# Patient Record
Sex: Male | Born: 1960 | Race: White | Hispanic: No | Marital: Married | Smoking: Never smoker
Health system: Southern US, Community
[De-identification: ages and names within clinical notes are randomized; demographics above are authoritative.]

## PROBLEM LIST (undated history)

## (undated) DIAGNOSIS — N200 Calculus of kidney: Secondary | ICD-10-CM

## (undated) DIAGNOSIS — T7840XA Allergy, unspecified, initial encounter: Secondary | ICD-10-CM

## (undated) DIAGNOSIS — K5790 Diverticulosis of intestine, part unspecified, without perforation or abscess without bleeding: Secondary | ICD-10-CM

## (undated) HISTORY — DX: Allergy, unspecified, initial encounter: T78.40XA

## (undated) HISTORY — PX: NO PAST SURGERIES: SHX2092

## (undated) HISTORY — DX: Diverticulosis of intestine, part unspecified, without perforation or abscess without bleeding: K57.90

## (undated) HISTORY — DX: Calculus of kidney: N20.0

---

## 1998-05-08 ENCOUNTER — Emergency Department (HOSPITAL_COMMUNITY): Admission: EM | Admit: 1998-05-08 | Discharge: 1998-05-08 | Payer: Self-pay | Admitting: Emergency Medicine

## 1998-05-11 ENCOUNTER — Encounter: Admission: RE | Admit: 1998-05-11 | Discharge: 1998-08-09 | Payer: Self-pay | Admitting: Internal Medicine

## 1999-03-17 ENCOUNTER — Emergency Department (HOSPITAL_COMMUNITY): Admission: EM | Admit: 1999-03-17 | Discharge: 1999-03-17 | Payer: Self-pay | Admitting: Emergency Medicine

## 2004-06-11 ENCOUNTER — Emergency Department (HOSPITAL_COMMUNITY): Admission: EM | Admit: 2004-06-11 | Discharge: 2004-06-11 | Payer: Self-pay | Admitting: Emergency Medicine

## 2004-07-04 ENCOUNTER — Ambulatory Visit (HOSPITAL_COMMUNITY): Admission: RE | Admit: 2004-07-04 | Discharge: 2004-07-04 | Payer: Self-pay | Admitting: Chiropractic Medicine

## 2007-08-28 ENCOUNTER — Emergency Department (HOSPITAL_COMMUNITY): Admission: EM | Admit: 2007-08-28 | Discharge: 2007-08-28 | Payer: Self-pay | Admitting: Emergency Medicine

## 2011-06-30 LAB — URINALYSIS, ROUTINE W REFLEX MICROSCOPIC
Glucose, UA: NEGATIVE
Hgb urine dipstick: NEGATIVE
Ketones, ur: 15 — AB
Protein, ur: NEGATIVE
pH: 5

## 2011-06-30 LAB — POCT CARDIAC MARKERS
CKMB, poc: <1 — ABNORMAL LOW
Operator id: 265201
Operator id: 285491
Troponin i, poc: <0.05
Troponin i, poc: <0.05

## 2011-06-30 LAB — COMPREHENSIVE METABOLIC PANEL
ALT: 13
AST: 17
CO2: 26
Chloride: 106
GFR calc Af Amer: >60
GFR calc non Af Amer: >60
Potassium: 4.2
Sodium: 139
Total Bilirubin: 1

## 2011-06-30 LAB — CBC
HCT: 44.1
Hemoglobin: 15.3
MCHC: 34.6
MCV: 86.8
Platelets: 236
RBC: 5.08
RDW: 12.9
WBC: 7.9

## 2011-06-30 LAB — DIFFERENTIAL
Basophils Absolute: 0
Eosinophils Absolute: 0.6
Eosinophils Relative: 7 — ABNORMAL HIGH

## 2011-06-30 LAB — PROTIME-INR: Prothrombin Time: 13.2

## 2013-09-21 ENCOUNTER — Other Ambulatory Visit: Payer: Self-pay | Admitting: Emergency Medicine

## 2013-09-21 MED ORDER — OSELTAMIVIR PHOSPHATE 75 MG PO CAPS: 75.0000 mg | ORAL_CAPSULE | Freq: Two times a day (BID) | ORAL | Status: DC

## 2013-09-21 MED ORDER — PREDNISONE (PAK) 10 MG PO TABS
ORAL_TABLET | Freq: Every day | ORAL | Status: DC
Start: 2013-09-21 — End: 2014-02-23

## 2013-09-21 MED ORDER — AZITHROMYCIN 250 MG PO TABS: ORAL_TABLET | ORAL | Status: AC

## 2013-09-21 MED ORDER — ONDANSETRON HCL 8 MG PO TABS: 8.0000 mg | ORAL_TABLET | Freq: Three times a day (TID) | ORAL | Status: DC | PRN

## 2014-02-23 ENCOUNTER — Encounter: Payer: Self-pay | Admitting: Physician Assistant

## 2014-02-23 ENCOUNTER — Ambulatory Visit (INDEPENDENT_AMBULATORY_CARE_PROVIDER_SITE_OTHER): Payer: Self-pay | Admitting: Physician Assistant

## 2014-02-23 VITALS — BP 120/80 | HR 60 | Temp 97.9°F | Resp 16 | Ht 73.0 in | Wt 209.0 lb

## 2014-02-23 DIAGNOSIS — T148 Other injury of unspecified body region: Secondary | ICD-10-CM

## 2014-02-23 DIAGNOSIS — W57XXXA Bitten or stung by nonvenomous insect and other nonvenomous arthropods, initial encounter: Secondary | ICD-10-CM

## 2014-02-23 MED ORDER — DOXYCYCLINE HYCLATE 100 MG PO TABS
100.0000 mg | ORAL_TABLET | Freq: Two times a day (BID) | ORAL | Status: DC
Start: 2014-02-23 — End: 2016-10-21

## 2014-02-23 NOTE — Progress Notes (Signed)
   Subjective:    Patient ID: Marcus Roberson, male    DOB: [birth year], 53 y.o.   MRN: [id number]  HPI 53 y.o. male with possible insect bite- unknown what kind. He has noticed it 2 days ago with itching, then last night he noticed that it had gotten very red with dark/ecchimosis around it.     Review of Systems  Constitutional: Negative.   HENT: Negative.   Respiratory: Negative.   Cardiovascular: Negative.   Gastrointestinal: Negative.   Genitourinary: Negative.   Musculoskeletal: Negative.   Skin: Positive for rash and wound.  Neurological: Negative.        Objective:   Physical Exam  Constitutional: He is oriented to person, place, and time. He appears well-developed and well-nourished.  Neck: Normal range of motion. Neck supple.  Cardiovascular: Normal rate and regular rhythm.   No murmur heard. Pulmonary/Chest: Effort normal and breath sounds normal. He has no wheezes.  Abdominal: Soft. Bowel sounds are normal. There is no tenderness.  Musculoskeletal: Normal range of motion. He exhibits no edema and no tenderness.  Lymphadenopathy:    He has no cervical adenopathy.  Neurological: He is alert and oriented to person, place, and time.  Skin: Skin is warm and dry. Rash (right hip 4x3 cm area of dark purple,red with induration/punctuate in the center) noted.      Assessment & Plan:  Insect bite without infection- no systemic symptoms- ice, hydrocortisone- if it becomes warm, spreads, or if he gets fever, joint pain, he will get Doxycycline #20.

## 2014-02-23 NOTE — Patient Instructions (Signed)
Insect Bite Mosquitoes, flies, fleas, bedbugs, and many other insects can bite. Insect bites are different from insect stings. A sting is when venom is injected into the skin. Some insect bites can transmit infectious diseases. SYMPTOMS  Insect bites usually turn red, swell, and itch for 2 to 4 days. They often go away on their own. TREATMENT  Your caregiver may prescribe antibiotic medicines if a bacterial infection develops in the bite. HOME CARE INSTRUCTIONS  Do not scratch the bite area.  Keep the bite area clean and dry. Wash the bite area thoroughly with soap and water.  Put ice or cool compresses on the bite area.  Put ice in a plastic bag.  Place a towel between your skin and the bag.  Leave the ice on for 20 minutes, 4 times a day for the first 2 to 3 days, or as directed.  You may apply a baking soda paste, cortisone cream, or calamine lotion to the bite area as directed by your caregiver. This can help reduce itching and swelling.  Only take over-the-counter or prescription medicines as directed by your caregiver.  If you are given antibiotics, take them as directed. Finish them even if you start to feel better. You may need a tetanus shot if:  You cannot remember when you had your last tetanus shot.  You have never had a tetanus shot.  The injury broke your skin. If you get a tetanus shot, your arm may swell, get red, and feel warm to the touch. This is common and not a problem. If you need a tetanus shot and you choose not to have one, there is a rare chance of getting tetanus. Sickness from tetanus can be serious. SEEK IMMEDIATE MEDICAL CARE IF:   You have increased pain, redness, or swelling in the bite area.  You see a red line on the skin coming from the bite.  You have a fever.  You have joint pain.  You have a headache or neck pain.  You have unusual weakness.  You have a rash.  You have chest pain or shortness of breath.  You have abdominal pain,  nausea, or vomiting.  You feel unusually tired or sleepy. MAKE SURE YOU:   Understand these instructions.  Will watch your condition.  Will get help right away if you are not doing well or get worse. Document Released: 10/16/2004 Document Revised: 12/01/2011 Document Reviewed: 04/09/2011 [facility] Patient Information 2014 [place], [place]. Brown Recluse Spider Bite The brown recluse spider is dark brown or light tan. It has a dark spot shaped like a violin on its back. The whole spider (with legs) can grow to be 1 inch (2.5 centimeters) long. Brown recluse spider bites can be serious and life-threatening. HOME CARE  Do not scratch the bite. Keep the bite clean and covered with a bandage.  Wash the bite every day with warm, soapy water.  Put ice on the bite.  Put ice in a plastic bag.  Place a towel between your skin and the bag.  Leave the ice on for 20 to 30 minutes, 3 to 4 times a day, or as told by your doctor.  Keep the bite raised (elevated) above the level of your heart.  Only take medicines as told by your doctor. You may need a tetanus shot if:  You cannot remember when you had your last tetanus shot.  You have never had a tetanus shot.  The injury broke your skin. If you need a tetanus  shot and you choose not to have one, you may get tetanus. Sickness from tetanus can be serious. GET HELP RIGHT AWAY IF:  Your sore (lesion) is getting larger (more than  inch [5 millimeters]), growing deeper, or looks infected.  You have chills or a fever.  You feel sick to your stomach (nauseous) or throw up (vomit).  You have muscle aches, shaking (convulsions), or a red rash.  You feel weak or very tired.  You are producing less pee (urine).  You have blood in your pee, or you notice other bleeding.  Your skin turns yellow.  Your problems do not get better after 24 hours, or they get worse.  You have more pain in the bite area. MAKE SURE YOU:   Understand these  instructions.  Will watch your condition.  Will get help right away if you are not doing well or get worse. Document Released: 08/28/2011 Document Revised: 12/01/2011 Document Reviewed: 08/28/2011 [facility] Patient Information 2014 [place]

## 2016-10-21 ENCOUNTER — Encounter: Payer: Self-pay | Admitting: Physician Assistant

## 2016-10-21 ENCOUNTER — Ambulatory Visit (INDEPENDENT_AMBULATORY_CARE_PROVIDER_SITE_OTHER): Payer: Self-pay | Admitting: Physician Assistant

## 2016-10-21 VITALS — BP 130/76 | HR 73 | Temp 97.6°F | Resp 14 | Ht 73.0 in | Wt 199.2 lb

## 2016-10-21 DIAGNOSIS — G5601 Carpal tunnel syndrome, right upper limb: Secondary | ICD-10-CM

## 2016-10-21 MED ORDER — PREDNISONE 20 MG PO TABS: ORAL_TABLET | ORAL | 0 refills | Status: AC

## 2016-10-21 MED ORDER — MELOXICAM 15 MG PO TABS: ORAL_TABLET | ORAL | 1 refills | Status: DC

## 2016-10-21 NOTE — Progress Notes (Signed)
   Subjective:    Patient ID: Marcus Roberson, male    DOB: [birth year], [age] y.o.   MRN: [id number]  HPI 56 y.o. right handed WM presents with right hand pain and numbness x 1 week. He has thumb pain at MCP x 3 months, bilateral hands but worse last week, and most recently this past week he has numbness tingling 1st-3rd digits. No injury. Mild neck pain if he holds his head up looking under houses for long periods of time, no shoulder, elbow pain. Has had some weakness in grip and dropping things.  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 Worse in the AM.   Blood pressure 130/76, pulse 73, temperature 97.6 F (36.4 C), resp. rate 14, height 6\' 1"  (1.854 m), weight 199 lb 3.2 oz (90.4 kg), SpO2 98 %.  Medications Current Outpatient Prescriptions on File Prior to Visit  Medication Sig  . Cetirizine HCl (ZYRTEC ALLERGY PO) Take by mouth daily.  . fluticasone (FLONASE) 50 MCG/ACT nasal spray Place 1 spray into both nostrils daily.   No current facility-administered medications on file prior to visit.     Problem list He  does not have a problem list on file.  Review of Systems  Constitutional: Negative.  Negative for chills and fever.  Musculoskeletal: Positive for arthralgias. Negative for back pain, gait problem, joint swelling, myalgias, neck pain and neck stiffness.  Skin: Negative.  Negative for rash.  Neurological: Positive for numbness. Negative for dizziness, tremors, seizures, syncope, facial asymmetry, speech difficulty, weakness, light-headedness and headaches.  Hematological: Negative.        Objective:   Physical Exam  Constitutional: He is oriented to person, place, and time. He appears well-developed and well-nourished. No distress.  Musculoskeletal: Normal range of motion.  Normal ROM neck, no spinal process tenderness, full ROM right shoulder, right elbow. Right carpal tunnel compression  test is positive, no thenar or hypothenar atrophy, sensation diminished on the palmar aspect of the hand digits 1-3, strength not diminished, swelling is not present, synovitis is not present, tenderness is present at right 1st MCP aspect, wrist has normal range of motion and normal pulses/cap refill, negative finkelstein test.   Neurological: He is alert and oriented to person, place, and time. He has normal reflexes. No cranial nerve deficit. Coordination normal.  Skin: Skin is warm and dry. No rash noted.       Assessment & Plan:    Carpal tunnel syndrome on right -     predniSONE (DELTASONE) 20 MG tablet; 1 pill 3 x a day for 3 days, 1 pill 2 x a day x 3 days, 1 pill a day x 5 days with food -     meloxicam (MOBIC) 15 MG tablet; Take one daily with food for 2 weeks, can take with tylenol, can not take with aleve, iburpofen, then as needed daily for pain - carpal tunnel brace at night x 6-8 weeks - stretches, heat or ice, rest - follow up 2 month for recheck and for labs - if not better may have to send to ortho despite being self pay due to affecting his work.

## 2016-10-21 NOTE — Patient Instructions (Signed)
Get carpal tunnel brace, wear at night x 6 weeks Take prednisone as directed, see instructions below May need to get on zantac, it can upset your stomach.  One you are done with the prednisone take the mobic once daily  Follow up 2 months   Carpal Tunnel Syndrome Carpal tunnel syndrome is a condition that causes pain in your hand and arm. The carpal tunnel is a narrow area located on the palm side of your wrist. Repeated wrist motion or certain diseases may cause swelling within the tunnel. This swelling pinches the main nerve in the wrist (median nerve). What are the causes? This condition may be caused by:  Repeated wrist motions.  Wrist injuries.  Arthritis.  A cyst or tumor in the carpal tunnel.  Fluid buildup during pregnancy. Sometimes the cause of this condition is not known. What increases the risk? This condition is more likely to develop in:  People who have jobs that cause them to repeatedly move their wrists in the same motion, such as Health visitorbutchers and cashiers.  Women.  People with certain conditions, such as:  Diabetes.  Obesity.  An underactive thyroid (hypothyroidism).  Kidney failure. What are the signs or symptoms? Symptoms of this condition include:  A tingling feeling in your fingers, especially in your thumb, index, and middle fingers.  Tingling or numbness in your hand.  An aching feeling in your entire arm, especially when your wrist and elbow are bent for long periods of time.  Wrist pain that goes up your arm to your shoulder.  Pain that goes down into your palm or fingers.  A weak feeling in your hands. You may have trouble grabbing and holding items. Your symptoms may feel worse during the night. How is this diagnosed? This condition is diagnosed with a medical history and physical exam. You may also have tests, including:  An electromyogram (EMG). This test measures electrical signals sent by your nerves into the  muscles.  X-rays. How is this treated? Treatment for this condition includes:  Lifestyle changes. It is important to stop doing or modify the activity that caused your condition.  Physical or occupational therapy.  Medicines for pain and inflammation. This may include medicine that is injected into your wrist.  A wrist splint.  Surgery. Follow these instructions at home: If you have a splint:  Wear it as told by your health care provider. Remove it only as told by your health care provider.  Loosen the splint if your fingers become numb and tingle, or if they turn cold and blue.  Keep the splint clean and dry. General instructions  Take over-the-counter and prescription medicines only as told by your health care provider.  Rest your wrist from any activity that may be causing your pain. If your condition is work related, talk to your employer about changes that can be made, such as getting a wrist pad to use while typing.  If directed, apply ice to the painful area:  Put ice in a plastic bag.  Place a towel between your skin and the bag.  Leave the ice on for 20 minutes, 2-3 times per day.  Keep all follow-up visits as told by your health care provider. This is important.  Do any exercises as told by your health care provider, physical therapist, or occupational therapist. Contact a health care provider if:  You have new symptoms.  Your pain is not controlled with medicines.  Your symptoms get worse. This information is not intended to  replace advice given to you by your health care provider. Make sure you discuss any questions you have with your health care provider. Document Released: 09/05/2000 Document Revised: 01/17/2016 Document Reviewed: 01/24/2015 Elsevier Interactive Patient Education  2017 [employer].

## 2016-12-22 ENCOUNTER — Ambulatory Visit: Payer: Self-pay | Admitting: Physician Assistant

## 2016-12-29 ENCOUNTER — Ambulatory Visit: Payer: Self-pay | Admitting: Physician Assistant

## 2017-07-26 NOTE — Progress Notes (Signed)
Assessment and Plan:  Marcus Roberson was seen today for weight loss, gi problem.  Diagnoses and all orders for this visit:  Epigastric pain -     ranitidine (ZANTAC) 300 MG tablet; Once at night time for 2 weeks, then as needed -     pantoprazole (PROTONIX) 40 MG tablet; Take 1 tablet (40 mg total) daily by mouth. -     CBC with Differential/Platelet -     Comprehensive metabolic panel -     Amylase -     Urinalysis, Complete (81001) -     POC Hemoccult Bld/Stl (3-Cd Home Screen); Future       -     Patient to avoid triggers in diet and improve quality of intake overall   Further disposition pending results of labs. Discussed med's effects and SE's.   Over 20 minutes of exam, counseling, chart review, and critical decision making was performed.   No future appointments.  ------------------------------------------------------------------------------------------------------------------   HPI BP (!) 108/54   Pulse 67   Temp 97.7 F (36.5 C)   Ht 6\' 1"  (1.854 m)   Wt 192 lb 6.4 oz (87.3 kg)   SpO2 97%   BMI 25.38 kg/m    55 y.o.male accompanied by his wife, has been seen intermittently at this office for acute visits presents today for past several years without CPE due to financial constraints- with concerns today of frequent epigastric pain after most meals, seems to be triggered worse after "eating junk" - particularly after the patient is snacking while watching TV at night. He also reports intermittent diarrhea after eating some foods, notably peanuts which seem to aggravate symptoms. He also endorses increased reflux symptoms which he is currently not taking anything for. His wife is concerned that he seems to have lost some weight; note a 7 lb weight loss since last visit 10 months ago. Denies melena, hematochezia, other notable changes in stool character, pain with BM, N/V. Denies urinary changes.  He denies known medical conditions, regular medications other than for allergies,  surgical history.   BMI is Body mass index is 25.38 kg/m. Wt Readings from Last 3 Encounters:  07/27/17 192 lb 6.4 oz (87.3 kg)  10/21/16 199 lb 3.2 oz (90.4 kg)  02/23/14 209 lb (94.8 kg)    History reviewed. No pertinent past medical history.   Allergies  Allergen Reactions  . Codeine Other (See Comments)    Stomach ache    Current Outpatient Medications on File Prior to Visit  Medication Sig  . Cetirizine HCl (ZYRTEC ALLERGY PO) Take by mouth daily.  . fluticasone (FLONASE) 50 MCG/ACT nasal spray Place 1 spray into both nostrils daily.  . meloxicam (MOBIC) 15 MG tablet Take one daily with food for 2 weeks, can take with tylenol, can not take with aleve, iburpofen, then as needed daily for pain   No current facility-administered medications on file prior to visit.     ROS: Review of Systems  Constitutional: Negative for malaise/fatigue and weight loss.  HENT: Negative for hearing loss and tinnitus.   Eyes: Negative for blurred vision and double vision.  Respiratory: Negative for cough, shortness of breath and wheezing.   Cardiovascular: Negative for chest pain, palpitations, orthopnea, claudication and leg swelling.  Gastrointestinal: Positive for abdominal pain, diarrhea and heartburn. Negative for blood in stool, constipation, melena, nausea and vomiting.  Genitourinary: Negative.  Negative for dysuria and urgency.  Musculoskeletal: Negative for joint pain and myalgias.  Skin: Negative for rash.  Neurological: Negative  for dizziness, tingling, sensory change, weakness and headaches.  Endo/Heme/Allergies: Negative for polydipsia.  Psychiatric/Behavioral: Negative.   All other systems reviewed and are negative.    Physical Exam:  BP (!) 108/54   Pulse 67   Temp 97.7 F (36.5 C)   Ht 6\' 1"  (1.854 m)   Wt 192 lb 6.4 oz (87.3 kg)   SpO2 97%   BMI 25.38 kg/m   General Appearance: Well nourished, in no apparent distress. Eyes: PERRLA, EOMs, conjunctiva no  swelling or erythema Sinuses: No Frontal/maxillary tenderness ENT/Mouth: Ext aud canals clear, TMs without erythema, bulging. No erythema, swelling, or exudate on post pharynx.  Tonsils not swollen or erythematous. Hearing normal.  Neck: Supple, thyroid normal.  Respiratory: Respiratory effort normal, BS equal bilaterally without rales, rhonchi, wheezing or stridor.  Cardio: RRR with no MRGs. Brisk peripheral pulses without edema.  Abdomen: Soft, + BS.  Epigastric tenderness to deep palpation, no guarding, rebound, hernias, masses. Lymphatics: Non tender without lymphadenopathy.  Musculoskeletal: Full ROM, 5/5 strength, normal gait.  Skin: Warm, dry without rashes, lesions, ecchymosis.  Neuro: Cranial nerves intact. Normal muscle tone, no cerebellar symptoms. Sensation intact.  Psych: Awake and oriented X 3, normal affect, Insight and Judgment appropriate.     Dan Maker, NP 4:14 PM [facility]

## 2017-07-27 ENCOUNTER — Ambulatory Visit (INDEPENDENT_AMBULATORY_CARE_PROVIDER_SITE_OTHER): Payer: Self-pay | Admitting: Adult Health

## 2017-07-27 ENCOUNTER — Encounter: Payer: Self-pay | Admitting: Adult Health

## 2017-07-27 VITALS — BP 108/54 | HR 67 | Temp 97.7°F | Ht 73.0 in | Wt 192.4 lb

## 2017-07-27 DIAGNOSIS — R1013 Epigastric pain: Secondary | ICD-10-CM

## 2017-07-27 MED ORDER — PANTOPRAZOLE SODIUM 40 MG PO TBEC: 40.0000 mg | DELAYED_RELEASE_TABLET | Freq: Every day | ORAL | 3 refills | Status: DC

## 2017-07-27 MED ORDER — RANITIDINE HCL 300 MG PO TABS: ORAL_TABLET | ORAL | 1 refills | Status: DC

## 2017-07-27 NOTE — Patient Instructions (Signed)
Heartburn Heartburn is a type of pain or discomfort that can happen in the throat or chest. It is often described as a burning pain. It may also cause a bad taste in the mouth. Heartburn may feel worse when you lie down or bend over, and it is often worse at night. Heartburn may be caused by stomach contents that move back up into the esophagus (reflux). Follow these instructions at home: Take these actions to decrease your discomfort and to help avoid complications. Diet  Follow a diet as recommended by your health care provider. This may involve avoiding foods and drinks such as: ? Coffee and tea (with or without caffeine). ? Drinks that contain alcohol. ? Energy drinks and sports drinks. ? Carbonated drinks or sodas. ? Chocolate and cocoa. ? Peppermint and mint flavorings. ? Garlic and onions. ? Horseradish. ? Spicy and acidic foods, including peppers, chili powder, curry powder, vinegar, hot sauces, and barbecue sauce. ? Citrus fruit juices and citrus fruits, such as oranges, lemons, and limes. ? Tomato-based foods, such as red sauce, chili, salsa, and pizza with red sauce. ? Fried and fatty foods, such as donuts, french fries, potato chips, and high-fat dressings. ? High-fat meats, such as hot dogs and fatty cuts of red and white meats, such as rib eye steak, sausage, ham, and bacon. ? High-fat dairy items, such as whole milk, butter, and cream cheese.  Eat small, frequent meals instead of large meals.  Avoid drinking large amounts of liquid with your meals.  Avoid eating meals during the 2-3 hours before bedtime.  Avoid lying down right after you eat.  Do not exercise right after you eat. General instructions  Pay attention to any changes in your symptoms.  Take over-the-counter and prescription medicines only as told by your health care provider. Do not take aspirin, ibuprofen, or other NSAIDs unless your health care provider told you to do so.  Do not use any tobacco  products, including cigarettes, chewing tobacco, and e-cigarettes. If you need help quitting, ask your health care provider.  Wear loose-fitting clothing. Do not wear anything tight around your waist that causes pressure on your abdomen.  Raise (elevate) the head of your bed about 6 inches (15 cm).  Try to reduce your stress, such as with yoga or meditation. If you need help reducing stress, ask your health care provider.  If you are overweight, reduce your weight to an amount that is healthy for you. Ask your health care provider for guidance about a safe weight loss goal.  Keep all follow-up visits as told by your health care provider. This is important. Contact a health care provider if:  You have new symptoms.  You have unexplained weight loss.  You have difficulty swallowing, or it hurts to swallow.  You have wheezing or a persistent cough.  Your symptoms do not improve with treatment.  You have frequent heartburn for more than two weeks. Get help right away if:  You have pain in your arms, neck, jaw, teeth, or back.  You feel sweaty, dizzy, or light-headed.  You have chest pain or shortness of breath.  You vomit and your vomit looks like blood or coffee grounds.  Your stool is bloody or black. This information is not intended to replace advice given to you by your health care provider. Make sure you discuss any questions you have with your health care provider. Document Released: 01/25/2009 Document Revised: 02/14/2016 Document Reviewed: 01/03/2015 Elsevier Interactive Patient Education  2017 [employer]   Inc.  

## 2017-07-28 LAB — COMPREHENSIVE METABOLIC PANEL
AG RATIO: 2.2 (calc) (ref 1.0–2.5)
ALKALINE PHOSPHATASE (APISO): 56 U/L (ref 40–115)
ALT: 11 U/L (ref 9–46)
AST: 12 U/L (ref 10–35)
Albumin: 4.4 g/dL (ref 3.6–5.1)
BILIRUBIN TOTAL: 0.6 mg/dL (ref 0.2–1.2)
BUN: 14 mg/dL (ref 7–25)
CALCIUM: 9.6 mg/dL (ref 8.6–10.3)
CHLORIDE: 105 mmol/L (ref 98–110)
CO2: 32 mmol/L (ref 20–32)
Creat: 1.21 mg/dL (ref 0.70–1.33)
Globulin: 2 g/dL (calc) (ref 1.9–3.7)
Glucose, Bld: 73 mg/dL (ref 65–99)
Potassium: 4.6 mmol/L (ref 3.5–5.3)
SODIUM: 142 mmol/L (ref 135–146)
Total Protein: 6.4 g/dL (ref 6.1–8.1)

## 2017-07-28 LAB — URINALYSIS, COMPLETE
Bacteria, UA: NONE SEEN /HPF
Bilirubin Urine: NEGATIVE
Glucose, UA: NEGATIVE
HGB URINE DIPSTICK: NEGATIVE
Hyaline Cast: NONE SEEN /LPF
Ketones, ur: NEGATIVE
Leukocytes, UA: NEGATIVE
Nitrite: NEGATIVE
PH: 7 (ref 5.0–8.0)
PROTEIN: NEGATIVE
RBC / HPF: NONE SEEN /HPF (ref 0–2)
Specific Gravity, Urine: 1.021 (ref 1.001–1.03)
Squamous Epithelial / LPF: NONE SEEN /HPF (ref <=5)
WBC UA: NONE SEEN /HPF (ref 0–5)

## 2017-07-28 LAB — CBC WITH DIFFERENTIAL/PLATELET
Basophils Absolute: 94 cells/uL (ref 0–200)
Basophils Relative: 1 %
EOS PCT: 9.2 %
Eosinophils Absolute: 865 cells/uL — ABNORMAL HIGH (ref 15–500)
HEMATOCRIT: 44.7 % (ref 38.5–50.0)
Hemoglobin: 15.5 g/dL (ref 13.2–17.1)
LYMPHS ABS: 1419 {cells}/uL (ref 850–3900)
MCH: 29.7 pg (ref 27.0–33.0)
MCHC: 34.7 g/dL (ref 32.0–36.0)
MCV: 85.6 fL (ref 80.0–100.0)
MPV: 11.2 fL (ref 7.5–12.5)
Monocytes Relative: 6.5 %
NEUTROS PCT: 68.2 %
Neutro Abs: 6411 cells/uL (ref 1500–7800)
Platelets: 261 10*3/uL (ref 140–400)
RBC: 5.22 10*6/uL (ref 4.20–5.80)
RDW: 12.9 % (ref 11.0–15.0)
TOTAL LYMPHOCYTE: 15.1 %
WBC mixed population: 611 cells/uL (ref 200–950)
WBC: 9.4 10*3/uL (ref 3.8–10.8)

## 2017-07-28 LAB — AMYLASE: AMYLASE: 85 U/L (ref 21–101)

## 2017-08-02 ENCOUNTER — Emergency Department (HOSPITAL_COMMUNITY)
Admission: EM | Admit: 2017-08-02 | Discharge: 2017-08-03 | Disposition: A | Discharge status: Home / Self Care | Payer: Self-pay | Attending: Emergency Medicine | Admitting: Emergency Medicine

## 2017-08-02 ENCOUNTER — Emergency Department (HOSPITAL_COMMUNITY): Payer: Self-pay

## 2017-08-02 ENCOUNTER — Encounter (HOSPITAL_COMMUNITY): Payer: Self-pay | Admitting: Emergency Medicine

## 2017-08-02 DIAGNOSIS — R52 Pain, unspecified: Secondary | ICD-10-CM

## 2017-08-02 DIAGNOSIS — R072 Precordial pain: Secondary | ICD-10-CM | POA: Insufficient documentation

## 2017-08-02 DIAGNOSIS — M25551 Pain in right hip: Secondary | ICD-10-CM | POA: Insufficient documentation

## 2017-08-02 DIAGNOSIS — M25552 Pain in left hip: Secondary | ICD-10-CM | POA: Insufficient documentation

## 2017-08-02 DIAGNOSIS — R410 Disorientation, unspecified: Secondary | ICD-10-CM | POA: Insufficient documentation

## 2017-08-02 DIAGNOSIS — Z885 Allergy status to narcotic agent status: Secondary | ICD-10-CM | POA: Insufficient documentation

## 2017-08-02 DIAGNOSIS — M549 Dorsalgia, unspecified: Secondary | ICD-10-CM | POA: Insufficient documentation

## 2017-08-02 DIAGNOSIS — R1084 Generalized abdominal pain: Secondary | ICD-10-CM | POA: Insufficient documentation

## 2017-08-02 LAB — RAPID URINE DRUG SCREEN, HOSP PERFORMED
AMPHETAMINES: NOT DETECTED
Barbiturates: NOT DETECTED
Benzodiazepines: NOT DETECTED
Cocaine: NOT DETECTED
OPIATES: NOT DETECTED
Tetrahydrocannabinol: POSITIVE — AB

## 2017-08-02 LAB — COMPREHENSIVE METABOLIC PANEL
ALK PHOS: 60 U/L (ref 38–126)
ALT: 13 U/L — AB (ref 17–63)
AST: 19 U/L (ref 15–41)
Albumin: 3.8 g/dL (ref 3.5–5.0)
Anion gap: 11 (ref 5–15)
BILIRUBIN TOTAL: 0.7 mg/dL (ref 0.3–1.2)
BUN: 12 mg/dL (ref 6–20)
CALCIUM: 9.4 mg/dL (ref 8.9–10.3)
CO2: 20 mmol/L — AB (ref 22–32)
CREATININE: 1.45 mg/dL — AB (ref 0.61–1.24)
Chloride: 107 mmol/L (ref 101–111)
GFR calc non Af Amer: 53 mL/min — ABNORMAL LOW (ref >60)
Glucose, Bld: 110 mg/dL — ABNORMAL HIGH (ref 65–99)
Potassium: 3.5 mmol/L (ref 3.5–5.1)
Sodium: 138 mmol/L (ref 135–145)
Total Protein: 6.2 g/dL — ABNORMAL LOW (ref 6.5–8.1)

## 2017-08-02 LAB — CBC WITH DIFFERENTIAL/PLATELET
Basophils Absolute: 0 10*3/uL (ref 0.0–0.1)
Basophils Relative: 0 %
EOS ABS: 0.4 10*3/uL (ref 0.0–0.7)
Eosinophils Relative: 5 %
HCT: 41.5 % (ref 39.0–52.0)
Hemoglobin: 14.3 g/dL (ref 13.0–17.0)
LYMPHS ABS: 1.2 10*3/uL (ref 0.7–4.0)
LYMPHS PCT: 12 %
MCH: 29.7 pg (ref 26.0–34.0)
MCHC: 34.5 g/dL (ref 30.0–36.0)
MCV: 86.1 fL (ref 78.0–100.0)
MONO ABS: 0.6 10*3/uL (ref 0.1–1.0)
Monocytes Relative: 6 %
Neutro Abs: 7.5 10*3/uL (ref 1.7–7.7)
Neutrophils Relative %: 77 %
PLATELETS: 264 10*3/uL (ref 150–400)
RBC: 4.82 MIL/uL (ref 4.22–5.81)
RDW: 13.5 % (ref 11.5–15.5)
WBC: 9.8 10*3/uL (ref 4.0–10.5)

## 2017-08-02 LAB — TROPONIN I: Troponin I: <0.03 ng/mL (ref <0.03)

## 2017-08-02 LAB — ETHANOL

## 2017-08-02 MED ORDER — MORPHINE SULFATE (PF) 4 MG/ML IV SOLN
4.0000 mg | Freq: Once | INTRAVENOUS | Status: AC
  Administered 2017-08-02: 4 mg via INTRAVENOUS
  Filled 2017-08-02: qty 1

## 2017-08-02 MED ORDER — IBUPROFEN 800 MG PO TABS: 800.0000 mg | ORAL_TABLET | Freq: Three times a day (TID) | ORAL | 0 refills | Status: DC | PRN

## 2017-08-02 MED ORDER — SODIUM CHLORIDE 0.9 % IV BOLUS (SEPSIS)
500.0000 mL | Freq: Once | INTRAVENOUS | Status: AC
  Administered 2017-08-02: 500 mL via INTRAVENOUS

## 2017-08-02 MED ORDER — FENTANYL CITRATE (PF) 100 MCG/2ML IJ SOLN
50.0000 ug | Freq: Once | INTRAMUSCULAR | Status: AC
  Administered 2017-08-02: 50 ug via INTRAVENOUS
  Filled 2017-08-02: qty 2

## 2017-08-02 MED ORDER — METHOCARBAMOL 500 MG PO TABS: 500.0000 mg | ORAL_TABLET | Freq: Two times a day (BID) | ORAL | 0 refills | Status: DC

## 2017-08-02 MED ORDER — IOPAMIDOL (ISOVUE-300) INJECTION 61%
INTRAVENOUS | Status: AC
  Administered 2017-08-02: 100 mL
  Filled 2017-08-02: qty 100

## 2017-08-02 MED ORDER — OXYCODONE-ACETAMINOPHEN 5-325 MG PO TABS: 1.0000 | ORAL_TABLET | ORAL | 0 refills | Status: DC | PRN

## 2017-08-02 NOTE — ED Notes (Signed)
EDP aware patient's pain has not changed

## 2017-08-02 NOTE — ED Notes (Signed)
Advised Dr. Jacqulyn BathLong pt c/o indigestion per sister at bedside.  Advised pt that if he waits for results he could have GI cocktail
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
## 2017-08-02 NOTE — Discharge Instructions (Signed)
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
## 2017-08-02 NOTE — ED Notes (Signed)
Patient transported to X-ray 

## 2018-02-24 ENCOUNTER — Ambulatory Visit (INDEPENDENT_AMBULATORY_CARE_PROVIDER_SITE_OTHER): Payer: Self-pay | Admitting: Internal Medicine

## 2018-02-24 VITALS — BP 100/56 | HR 88 | Temp 99.7°F | Resp 18 | Ht 73.0 in | Wt 192.4 lb

## 2018-02-24 DIAGNOSIS — L02416 Cutaneous abscess of left lower limb: Secondary | ICD-10-CM

## 2018-02-24 MED ORDER — DOXYCYCLINE HYCLATE 100 MG PO CAPS: ORAL_CAPSULE | ORAL | 0 refills | Status: DC

## 2018-02-24 NOTE — Progress Notes (Signed)
   Subjective:    Patient ID: Marcus Roberson, male    DOB: [birth year], 57 y.o.   MRN: [id number]  HPI  This nice 57 yo MWM presents with c/o "boil" on his anteromedial Lt thigh becoming larger & more painful over the preceding 3-4 days.   Medication Sig  . Cetirizine HCl (ZYRTEC ALLERGY PO) Take by mouth daily.  . fluticasone (FLONASE) 50 MCG/ACT nasal spray Place 1 spray into both nostrils daily.   Ng PMHx   Review of Systems  10 point systems review negative except as above.    Objective:   Physical Exam   BP (!) 100/56   Pulse 88   Temp 99.7 F (37.6 C)   Resp 18   Ht 6\' 1"  (1.854 m)   Wt 192 lb 6.4 oz (87.3 kg)   BMI 25.38 kg/m   Exam focused on the LLE finds a tender inflamed abscess of the Lt anteromedial thigh with surrounding erythema. No Lymphangitic streaking.      Assessment & Plan:   1. Abscess of left thigh  - doxycycline (VIBRAMYCIN) 100 MG capsule; Take 2 capsules now & 1 at supper, then 1 capsule 2 x/day with food  Dispense: 30 capsule  - discussed hot tub soaks, dressings and med effects & SE's.   - ROV 1 week

## 2018-02-24 NOTE — Patient Instructions (Signed)

## 2018-02-28 ENCOUNTER — Encounter: Payer: Self-pay | Admitting: Internal Medicine

## 2018-03-03 ENCOUNTER — Ambulatory Visit: Payer: Self-pay | Admitting: Internal Medicine

## 2019-07-12 ENCOUNTER — Encounter: Payer: Self-pay | Admitting: Internal Medicine

## 2019-07-12 NOTE — Patient Instructions (Signed)

## 2019-07-12 NOTE — Progress Notes (Signed)
Annual  Screening/Preventative Visit  & Comprehensive Evaluation & Examination     This very nice 58 y.o.  MWM presents for a Screening /Preventative Visit & comprehensive evaluation and management of multiple medical co-morbidities.  Patient is followed expectantly for labile or elevated BP, lipids, abnormal glucose and Vitamin D Deficiency.     Patient's BP has been controlled at home.  Today's BP is at goal - 110/64. Patient denies any cardiac symptoms as chest pain, palpitations, shortness of breath, dizziness or ankle swelling.     Patient's hyperlipidemia is controlled with diet and medications. Patient denies myalgias or other medication SE's.         Patient is screened expectantly fort glucose intolerance and patient denies reactive hypoglycemic symptoms, visual blurring, diabetic polys or paresthesias.      Finally, patient is anticipated to have Vitamin D Deficiency.   Current Outpatient Medications on File Prior to Visit  Medication Sig  . Cetirizine HCl (ZYRTEC ALLERGY PO) Take by mouth daily.  . fluticasone (FLONASE) 50 MCG/ACT nasal spray Place 1 spray into both nostrils daily.   No current facility-administered medications on file prior to visit.    Allergies  Allergen Reactions  . Codeine Other (See Comments)    Stomach ache   History reviewed. No pertinent past medical history. Health Maintenance  Topic Date Due  . Hepatitis C Screening  12-28-1960  . HIV Screening  09/09/1977  . COLONOSCOPY  09/09/2012  . INFLUENZA VACCINE  04/23/2019  . TETANUS/TDAP  07/12/2029   - No Colonoscopy, but is agreeable to do Cologard   History reviewed. No pertinent surgical history.   History reviewed. No pertinent family history.   Social History   Socioeconomic History  . Marital status: Married    Spouse name: Okey Regal  . Number of children: 1 daughter  Occupational History  . Not on file  Tobacco Use  . Smoking status: Never Smoker  . Smokeless tobacco: Never  Used  Substance and Sexual Activity  . Alcohol use: No    Frequency: Never  . Drug use: No  . Sexual activity: Not on file    ROS Constitutional: Denies fever, chills, weight loss/gain, headaches, insomnia,  night sweats or change in appetite. Does c/o fatigue. Eyes: Denies redness, blurred vision, diplopia, discharge, itchy or watery eyes.  ENT: Denies discharge, congestion, post nasal drip, epistaxis, sore throat, earache, hearing loss, dental pain, Tinnitus, Vertigo, Sinus pain or snoring.  Cardio: Denies chest pain, palpitations, irregular heartbeat, syncope, dyspnea, diaphoresis, orthopnea, PND, claudication or edema Respiratory: denies cough, dyspnea, DOE, pleurisy, hoarseness, laryngitis or wheezing.  Gastrointestinal: Denies dysphagia, heartburn, reflux, water brash, pain, cramps, nausea, vomiting, bloating, diarrhea, constipation, hematemesis, melena, hematochezia, jaundice or hemorrhoids Genitourinary: Denies dysuria, frequency, urgency, nocturia, hesitancy, discharge, hematuria or flank pain Musculoskeletal: Denies arthralgia, myalgia, stiffness, Jt. Swelling, pain, limp or strain/sprain. Denies Falls. Skin: Denies puritis, rash, hives, warts, acne, eczema or change in skin lesion Neuro: No weakness, tremor, incoordination, spasms, paresthesia or pain Psychiatric: Denies confusion, memory loss or sensory loss. Denies Depression. Endocrine: Denies change in weight, skin, hair change, nocturia, and paresthesia, diabetic polys, visual blurring or hyper / hypo glycemic episodes.  Heme/Lymph: No excessive bleeding, bruising or enlarged lymph nodes.  Physical Exam  BP 110/64   Pulse 72   Temp (!) 97.2 F (36.2 C)   Resp 16   Ht 6' 1.5" (1.867 m)   Wt 195 lb 3.2 oz (88.5 kg)   BMI 25.40 kg/m  General Appearance: Well nourished and well groomed and in no apparent distress.  Eyes: PERRLA, EOMs, conjunctiva no swelling or erythema, normal fundi and vessels. Sinuses: No  frontal/maxillary tenderness ENT/Mouth: EACs patent / TMs  nl. Nares clear without erythema, swelling, mucoid exudates. Oral hygiene is good. No erythema, swelling, or exudate. Tongue normal, non-obstructing. Tonsils not swollen or erythematous. Hearing normal.  Neck: Supple, thyroid not palpable. No bruits, nodes or JVD. Respiratory: Respiratory effort normal.  BS equal and clear bilateral without rales, rhonci, wheezing or stridor. Cardio: Heart sounds are normal with regular rate and rhythm and no murmurs, rubs or gallops. Peripheral pulses are normal and equal bilaterally without edema. No aortic or femoral bruits. Chest: symmetric with normal excursions and percussion.  Abdomen: Soft, with Nl bowel sounds. Nontender, no guarding, rebound, hernias, masses, or organomegaly.  Lymphatics: Non tender without lymphadenopathy.  Musculoskeletal: Full ROM all peripheral extremities, joint stability, 5/5 strength, and normal gait. Skin: Warm and dry without rashes, lesions, cyanosis, clubbing or  ecchymosis.  Neuro: Cranial nerves intact, reflexes equal bilaterally. Normal muscle tone, no cerebellar symptoms. Sensation intact.  Pysch: Alert and oriented X 3 with normal affect, insight and judgment appropriate.   Assessment and Plan  1. Annual Preventative/Screening Exam   2. Elevated BP without diagnosis of hypertension  - EKG 12-Lead - [place], RETROPERITNL ABD,  LTD - Urinalysis, Routine w reflex microscopic - Microalbumin / creatinine urine ratio - CBC with Differential/Platelet - COMPLETE METABOLIC PANEL WITH GFR - Magnesium - TSH  3. Hyperlipidemia, mixed  - EKG 12-Lead - [place], RETROPERITNL ABD,  LTD - Lipid panel - TSH  4. Abnormal glucose  - EKG 12-Lead - [place], RETROPERITNL ABD,  LTD - Hemoglobin A1c - Insulin, random  5. 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 Screening-pulmonary TB  - TB Skin Test  8.  Prostate cancer screening  - PSA  9. Screening for colorectal cancer  - POC Hemoccult Bld/Stl  10. Screening for ischemic heart disease  - EKG 12-Lead  11. FHx: heart disease  - EKG 12-Lead - [place], RETROPERITNL ABD,  LTD  12. Screening for AAA (aortic abdominal aneurysm)  - [place], RETROPERITNL ABD,  LTD  13. Fatigue  - Iron,Total/Total Iron Binding Cap - Vitamin B12 - Testosterone - CBC with Differential/Platelet - TSH  14. Medication management  - Urinalysis, Routine w reflex microscopic - Microalbumin / creatinine urine ratio - CBC with Differential/Platelet - COMPLETE METABOLIC PANEL WITH GFR - Magnesium - Lipid panel - TSH - Hemoglobin A1c - Insulin, random - VITAMIN D 25 Hydroxyl  15. Need for prophylactic vaccination with combined diphtheria-tetanus-pertussis (DTP) vaccine  - Tdap vaccine greater than or equal to 7yo IM               Patient was counseled in prudent diet, weight control to achieve/maintain BMI less than 25, BP monitoring, regular exercise and medications as discussed.  Discussed med effects and SE's. Routine screening labs and tests as requested with regular follow-up as recommended. Over 40 minutes of exam, counseling, chart review and high complex critical decision making was performed   Kirtland Bouchard, MD

## 2019-07-13 ENCOUNTER — Ambulatory Visit (INDEPENDENT_AMBULATORY_CARE_PROVIDER_SITE_OTHER): Payer: 59 | Admitting: Internal Medicine

## 2019-07-13 ENCOUNTER — Encounter: Payer: Self-pay | Admitting: Internal Medicine

## 2019-07-13 ENCOUNTER — Other Ambulatory Visit: Payer: Self-pay

## 2019-07-13 VITALS — BP 110/64 | HR 72 | Temp 97.2°F | Resp 16 | Ht 73.5 in | Wt 195.2 lb

## 2019-07-13 DIAGNOSIS — Z125 Encounter for screening for malignant neoplasm of prostate: Secondary | ICD-10-CM | POA: Diagnosis not present

## 2019-07-13 DIAGNOSIS — R030 Elevated blood-pressure reading, without diagnosis of hypertension: Secondary | ICD-10-CM

## 2019-07-13 DIAGNOSIS — E782 Mixed hyperlipidemia: Secondary | ICD-10-CM

## 2019-07-13 DIAGNOSIS — Z8249 Family history of ischemic heart disease and other diseases of the circulatory system: Secondary | ICD-10-CM

## 2019-07-13 DIAGNOSIS — Z13220 Encounter for screening for lipoid disorders: Secondary | ICD-10-CM | POA: Diagnosis not present

## 2019-07-13 DIAGNOSIS — Z1211 Encounter for screening for malignant neoplasm of colon: Secondary | ICD-10-CM

## 2019-07-13 DIAGNOSIS — Z0000 Encounter for general adult medical examination without abnormal findings: Secondary | ICD-10-CM

## 2019-07-13 DIAGNOSIS — Z111 Encounter for screening for respiratory tuberculosis: Secondary | ICD-10-CM | POA: Diagnosis not present

## 2019-07-13 DIAGNOSIS — Z79899 Other long term (current) drug therapy: Secondary | ICD-10-CM

## 2019-07-13 DIAGNOSIS — Z136 Encounter for screening for cardiovascular disorders: Secondary | ICD-10-CM | POA: Diagnosis not present

## 2019-07-13 DIAGNOSIS — Z131 Encounter for screening for diabetes mellitus: Secondary | ICD-10-CM | POA: Diagnosis not present

## 2019-07-13 DIAGNOSIS — Z1389 Encounter for screening for other disorder: Secondary | ICD-10-CM

## 2019-07-13 DIAGNOSIS — N401 Enlarged prostate with lower urinary tract symptoms: Secondary | ICD-10-CM

## 2019-07-13 DIAGNOSIS — Z23 Encounter for immunization: Secondary | ICD-10-CM

## 2019-07-13 DIAGNOSIS — R350 Frequency of micturition: Secondary | ICD-10-CM | POA: Diagnosis not present

## 2019-07-13 DIAGNOSIS — E559 Vitamin D deficiency, unspecified: Secondary | ICD-10-CM

## 2019-07-13 DIAGNOSIS — Z1212 Encounter for screening for malignant neoplasm of rectum: Secondary | ICD-10-CM

## 2019-07-13 DIAGNOSIS — R5383 Other fatigue: Secondary | ICD-10-CM

## 2019-07-13 DIAGNOSIS — N138 Other obstructive and reflux uropathy: Secondary | ICD-10-CM

## 2019-07-13 DIAGNOSIS — R7309 Other abnormal glucose: Secondary | ICD-10-CM

## 2019-07-13 DIAGNOSIS — Z0001 Encounter for general adult medical examination with abnormal findings: Secondary | ICD-10-CM

## 2019-07-14 LAB — URINALYSIS, ROUTINE W REFLEX MICROSCOPIC
Bilirubin Urine: NEGATIVE
Glucose, UA: NEGATIVE
Hgb urine dipstick: NEGATIVE
Ketones, ur: NEGATIVE
Leukocytes,Ua: NEGATIVE
Nitrite: NEGATIVE
Protein, ur: NEGATIVE
Specific Gravity, Urine: 1.018 (ref 1.001–1.03)
pH: 5.5 (ref 5.0–8.0)

## 2019-07-14 LAB — PSA: PSA: 0.2 ng/mL (ref <=4.0)

## 2019-07-14 LAB — COMPLETE METABOLIC PANEL WITH GFR
AG Ratio: 2 (calc) (ref 1.0–2.5)
ALT: 8 U/L — ABNORMAL LOW (ref 9–46)
AST: 11 U/L (ref 10–35)
Albumin: 4.1 g/dL (ref 3.6–5.1)
Alkaline phosphatase (APISO): 59 U/L (ref 35–144)
BUN: 9 mg/dL (ref 7–25)
CO2: 27 mmol/L (ref 20–32)
Calcium: 9.6 mg/dL (ref 8.6–10.3)
Chloride: 106 mmol/L (ref 98–110)
Creat: 1.15 mg/dL (ref 0.70–1.33)
GFR, Est African American: 82 mL/min/{1.73_m2} (ref >=60)
GFR, Est Non African American: 71 mL/min/{1.73_m2} (ref >=60)
Globulin: 2.1 g/dL (calc) (ref 1.9–3.7)
Glucose, Bld: 84 mg/dL (ref 65–99)
Potassium: 4.2 mmol/L (ref 3.5–5.3)
Sodium: 142 mmol/L (ref 135–146)
Total Bilirubin: 0.7 mg/dL (ref 0.2–1.2)
Total Protein: 6.2 g/dL (ref 6.1–8.1)

## 2019-07-14 LAB — TESTOSTERONE: Testosterone: 265 ng/dL (ref 250–827)

## 2019-07-14 LAB — LIPID PANEL
Cholesterol: 162 mg/dL (ref <200)
HDL: 48 mg/dL (ref >=40)
LDL Cholesterol (Calc): 92 mg/dL (calc)
Non-HDL Cholesterol (Calc): 114 mg/dL (calc) (ref <130)
Total CHOL/HDL Ratio: 3.4 (calc) (ref <5.0)
Triglycerides: 128 mg/dL (ref <150)

## 2019-07-14 LAB — TSH: TSH: 0.89 mIU/L (ref 0.40–4.50)

## 2019-07-14 LAB — CBC WITH DIFFERENTIAL/PLATELET
Absolute Monocytes: 527 cells/uL (ref 200–950)
Basophils Absolute: 57 cells/uL (ref 0–200)
Basophils Relative: 0.7 %
Eosinophils Absolute: 818 cells/uL — ABNORMAL HIGH (ref 15–500)
Eosinophils Relative: 10.1 %
HCT: 43.1 % (ref 38.5–50.0)
Hemoglobin: 14.7 g/dL (ref 13.2–17.1)
Lymphs Abs: 1385 cells/uL (ref 850–3900)
MCH: 29.7 pg (ref 27.0–33.0)
MCHC: 34.1 g/dL (ref 32.0–36.0)
MCV: 87.1 fL (ref 80.0–100.0)
MPV: 11.5 fL (ref 7.5–12.5)
Monocytes Relative: 6.5 %
Neutro Abs: 5314 cells/uL (ref 1500–7800)
Neutrophils Relative %: 65.6 %
Platelets: 246 10*3/uL (ref 140–400)
RBC: 4.95 10*6/uL (ref 4.20–5.80)
RDW: 12.7 % (ref 11.0–15.0)
Total Lymphocyte: 17.1 %
WBC: 8.1 10*3/uL (ref 3.8–10.8)

## 2019-07-14 LAB — MICROALBUMIN / CREATININE URINE RATIO
Creatinine, Urine: 156 mg/dL (ref 20–320)
Microalb Creat Ratio: 4 mcg/mg creat (ref <30)
Microalb, Ur: 0.6 mg/dL

## 2019-07-14 LAB — IRON, TOTAL/TOTAL IRON BINDING CAP: %SAT: 26 % (calc) (ref 20–48)

## 2019-07-14 LAB — HEMOGLOBIN A1C
Hgb A1c MFr Bld: 5.1 % of total Hgb (ref <5.7)
Mean Plasma Glucose: 100 (calc)
eAG (mmol/L): 5.5 (calc)

## 2019-07-14 LAB — IRON,?TOTAL/TOTAL IRON BINDING CAP
Iron: 75 ug/dL (ref 50–180)
TIBC: 284 mcg/dL (calc) (ref 250–425)

## 2019-07-14 LAB — MAGNESIUM: Magnesium: 2 mg/dL (ref 1.5–2.5)

## 2019-07-14 LAB — VITAMIN B12: Vitamin B-12: 300 pg/mL (ref 200–1100)

## 2019-07-14 LAB — INSULIN, RANDOM: Insulin: 12.6 u[IU]/mL

## 2019-07-14 LAB — VITAMIN D 25 HYDROXY (VIT D DEFICIENCY, FRACTURES): Vit D, 25-Hydroxy: 25 ng/mL — ABNORMAL LOW (ref 30–100)

## 2019-07-14 MED ORDER — DICYCLOMINE HCL 20 MG PO TABS: ORAL_TABLET | ORAL | 1 refills | Status: DC

## 2019-07-17 ENCOUNTER — Encounter: Payer: Self-pay | Admitting: Internal Medicine

## 2019-07-18 ENCOUNTER — Encounter: Payer: Self-pay | Admitting: *Deleted

## 2019-07-18 LAB — TB SKIN TEST
Induration: 0 mm
TB Skin Test: NEGATIVE

## 2019-08-11 ENCOUNTER — Telehealth: Payer: Self-pay | Admitting: *Deleted

## 2019-08-11 NOTE — Telephone Encounter (Signed)
Mailed a hemoccult card to the patient.
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
## 2019-09-14 ENCOUNTER — Other Ambulatory Visit: Payer: Self-pay

## 2019-09-14 ENCOUNTER — Encounter (HOSPITAL_COMMUNITY): Payer: Self-pay | Admitting: Emergency Medicine

## 2019-09-14 ENCOUNTER — Emergency Department (HOSPITAL_COMMUNITY)
Admission: EM | Admit: 2019-09-14 | Discharge: 2019-09-14 | Disposition: A | Discharge status: Home / Self Care | Payer: 59 | Attending: Emergency Medicine | Admitting: Emergency Medicine

## 2019-09-14 ENCOUNTER — Emergency Department (HOSPITAL_COMMUNITY): Payer: 59

## 2019-09-14 DIAGNOSIS — Z79899 Other long term (current) drug therapy: Secondary | ICD-10-CM | POA: Diagnosis not present

## 2019-09-14 DIAGNOSIS — R1031 Right lower quadrant pain: Secondary | ICD-10-CM | POA: Diagnosis present

## 2019-09-14 DIAGNOSIS — N200 Calculus of kidney: Secondary | ICD-10-CM | POA: Diagnosis not present

## 2019-09-14 LAB — COMPREHENSIVE METABOLIC PANEL
ALT: 15 U/L (ref 0–44)
AST: 15 U/L (ref 15–41)
Albumin: 3.5 g/dL (ref 3.5–5.0)
Alkaline Phosphatase: 46 U/L (ref 38–126)
Anion gap: 6 (ref 5–15)
BUN: 18 mg/dL (ref 6–20)
CO2: 24 mmol/L (ref 22–32)
Calcium: 8.2 mg/dL — ABNORMAL LOW (ref 8.9–10.3)
Chloride: 110 mmol/L (ref 98–111)
Creatinine, Ser: 1.13 mg/dL (ref 0.61–1.24)
GFR calc Af Amer: >60 mL/min (ref >60)
GFR calc non Af Amer: >60 mL/min (ref >60)
Glucose, Bld: 119 mg/dL — ABNORMAL HIGH (ref 70–99)
Potassium: 4.1 mmol/L (ref 3.5–5.1)
Sodium: 140 mmol/L (ref 135–145)
Total Bilirubin: 0.9 mg/dL (ref 0.3–1.2)
Total Protein: 5.9 g/dL — ABNORMAL LOW (ref 6.5–8.1)

## 2019-09-14 LAB — URINALYSIS, ROUTINE W REFLEX MICROSCOPIC
Bilirubin Urine: NEGATIVE
Glucose, UA: NEGATIVE mg/dL
Ketones, ur: 80 mg/dL — AB
Nitrite: NEGATIVE
Protein, ur: 30 mg/dL — AB
RBC / HPF: >50 RBC/hpf — ABNORMAL HIGH (ref 0–5)
Specific Gravity, Urine: 1.021 (ref 1.005–1.030)
pH: 5 (ref 5.0–8.0)

## 2019-09-14 LAB — CBC WITH DIFFERENTIAL/PLATELET
Abs Immature Granulocytes: 0.05 10*3/uL (ref 0.00–0.07)
Basophils Absolute: 0 10*3/uL (ref 0.0–0.1)
Basophils Relative: 0 %
Eosinophils Absolute: 0 10*3/uL (ref 0.0–0.5)
Eosinophils Relative: 0 %
HCT: 40 % (ref 39.0–52.0)
Hemoglobin: 13.2 g/dL (ref 13.0–17.0)
Immature Granulocytes: 0 %
Lymphocytes Relative: 4 %
Lymphs Abs: 0.5 10*3/uL — ABNORMAL LOW (ref 0.7–4.0)
MCH: 29.2 pg (ref 26.0–34.0)
MCHC: 33 g/dL (ref 30.0–36.0)
MCV: 88.5 fL (ref 80.0–100.0)
Monocytes Absolute: 0.5 10*3/uL (ref 0.1–1.0)
Monocytes Relative: 4 %
Neutro Abs: 12 10*3/uL — ABNORMAL HIGH (ref 1.7–7.7)
Neutrophils Relative %: 92 %
Platelets: 231 10*3/uL (ref 150–400)
RBC: 4.52 MIL/uL (ref 4.22–5.81)
RDW: 12.9 % (ref 11.5–15.5)
WBC: 13 10*3/uL — ABNORMAL HIGH (ref 4.0–10.5)
nRBC: 0 % (ref 0.0–0.2)

## 2019-09-14 LAB — LIPASE, BLOOD: Lipase: 28 U/L (ref 11–51)

## 2019-09-14 MED ORDER — KETOROLAC TROMETHAMINE 15 MG/ML IJ SOLN
15.0000 mg | Freq: Once | INTRAMUSCULAR | Status: AC
  Administered 2019-09-14: 13:00:00 15 mg via INTRAVENOUS
  Filled 2019-09-14: qty 1

## 2019-09-14 MED ORDER — SODIUM CHLORIDE 0.9 % IV BOLUS
1000.0000 mL | Freq: Once | INTRAVENOUS | Status: AC
  Administered 2019-09-14: 1000 mL via INTRAVENOUS

## 2019-09-14 MED ORDER — TAMSULOSIN HCL 0.4 MG PO CAPS: 0.4000 mg | ORAL_CAPSULE | Freq: Every day | ORAL | 0 refills | Status: DC

## 2019-09-14 MED ORDER — ONDANSETRON HCL 4 MG/2ML IJ SOLN
4.0000 mg | Freq: Once | INTRAMUSCULAR | Status: AC
  Administered 2019-09-14: 4 mg via INTRAVENOUS
  Filled 2019-09-14: qty 2

## 2019-09-14 MED ORDER — MORPHINE SULFATE (PF) 4 MG/ML IV SOLN
4.0000 mg | Freq: Once | INTRAVENOUS | Status: AC
  Administered 2019-09-14: 4 mg via INTRAVENOUS
  Filled 2019-09-14: qty 1

## 2019-09-14 MED ORDER — HYDROCODONE-ACETAMINOPHEN 5-325 MG PO TABS: 1.0000 | ORAL_TABLET | Freq: Four times a day (QID) | ORAL | 0 refills | Status: DC | PRN

## 2019-09-14 NOTE — ED Notes (Signed)
Pt advised unable to urinate at this time, will monitor. 

## 2019-09-14 NOTE — Discharge Instructions (Signed)
You have a 2 mm stone in your right ureter. Drink plenty of fluids. Call urology for follow-up. Get rechecked immediately if you develop fevers, cannot urinate or have uncontrolled pain.

## 2019-09-14 NOTE — ED Triage Notes (Signed)
Pt c/o right flank pains with n/v that started around 130am today. Denies bowel or urinary problems.

## 2019-09-14 NOTE — Progress Notes (Signed)
TOC CM received call from pt's wife stating he is allergic to hydrocodone, message sent to EDP. [place], [place] ED TOC CM [phone]

## 2019-09-14 NOTE — ED Provider Notes (Signed)
[facility]-EMERGENCY DEPT Provider Note   CSN: [id number] Arrival date & time: 09/14/19  8469     History Chief Complaint  Patient presents with  . Flank Pain  . Emesis    Marcus Roberson is a 58 y.o. male.  The history is provided by the patient and medical records. No language interpreter was used.  Flank Pain  Emesis  Marcus Roberson is a 58 y.o. male who presents to the Emergency Department complaining of abdominal pain.  He presents to the ED complaining of sudden onset severe generalized abdominal pain and right flank pain.  Pain began at 130 this morning and woke him from sleep.  He has associated nausea, vomiting, dry heaves.  He has abdominal pain with breathing.  No fevers, cough, diarrhea, dysuria.  No prior similar sxs.  No known covid 19 exposures.      History reviewed. No pertinent past medical history.  There are no problems to display for this patient.   History reviewed. No pertinent surgical history.     No family history on file.  Social History   Tobacco Use  . Smoking status: Never Smoker  . Smokeless tobacco: Never Used  Substance Use Topics  . Alcohol use: No  . Drug use: No    Home Medications Prior to Admission medications   Medication Sig Start Date End Date Taking? Authorizing Provider  cetirizine (ZYRTEC ALLERGY) 10 MG tablet Take 10 mg by mouth daily.    Yes [provider]  dicyclomine (BENTYL) 20 MG tablet Take 1 tablet 3 x  /day as needed for Nausea, Bloating, Abdominal Cramping or Aching Patient taking differently: Take 20 mg by mouth 3 (three) times daily as needed (nausea, bloating, abominal cramping or aching).  07/14/19  Yes [name], MD  fluticasone [facility]) 50 MCG/ACT nasal spray Place 1 spray into both nostrils daily.   Yes [provider]  HYDROcodone-acetaminophen (NORCO/VICODIN) 5-325 MG tablet Take 1 tablet by mouth every 6 (six) hours as needed. 09/14/19   Tilden Fossa, MD  tamsulosin (FLOMAX) 0.4 MG CAPS capsule Take 1 capsule (0.4 mg total) by mouth daily. 09/14/19   Tilden Fossa, MD    Allergies    Codeine  Review of Systems   Review of Systems  Gastrointestinal: Positive for vomiting.  Genitourinary: Positive for flank pain.  All other systems reviewed and are negative.   Physical Exam Updated Vital Signs BP 120/67 (BP Location: Right Arm)   Pulse 61   Temp 97.6 F (36.4 C) (Oral)   Resp 15   SpO2 98%   Physical Exam Vitals and nursing note reviewed.  Constitutional:      Appearance: He is well-developed.  HENT:     Head: Normocephalic and atraumatic.  Cardiovascular:     Rate and Rhythm: Normal rate and regular rhythm.     Heart sounds: No murmur.  Pulmonary:     Effort: Pulmonary effort is normal. No respiratory distress.     Breath sounds: Normal breath sounds.  Abdominal:     Palpations: Abdomen is soft.     Tenderness: There is no guarding or rebound.     Comments: Moderate generalized abdominal tenderness, right CVA tenderness  Musculoskeletal:        General: No swelling or tenderness.     Comments: 2+ DP pulses bilaterally  Skin:    General: Skin is warm and dry.  Neurological:     Mental Status: He is alert and oriented  to person, place, and time.  Psychiatric:        Behavior: Behavior normal.     ED Results / Procedures / Treatments   Labs (all labs ordered are listed, but only abnormal results are displayed) Labs Reviewed  COMPREHENSIVE METABOLIC PANEL - Abnormal; Notable for the following components:      Result Value   Glucose, Bld 119 (*)    Calcium 8.2 (*)    Total Protein 5.9 (*)    All other components within normal limits  CBC WITH DIFFERENTIAL/PLATELET - Abnormal; Notable for the following components:   WBC 13.0 (*)    Neutro Abs 12.0 (*)    Lymphs Abs 0.5 (*)    All other components within normal limits  URINALYSIS, ROUTINE W REFLEX MICROSCOPIC - Abnormal; Notable for the  following components:   APPearance HAZY (*)    Hgb urine dipstick LARGE (*)    Ketones, ur 80 (*)    Protein, ur 30 (*)    Leukocytes,Ua TRACE (*)    RBC / HPF >50 (*)    Bacteria, UA RARE (*)    All other components within normal limits  URINE CULTURE  LIPASE, BLOOD    EKG None  Radiology CT Renal Stone Study  Result Date: 09/14/2019 CLINICAL DATA:  Right flank pain EXAM: CT ABDOMEN AND PELVIS WITHOUT CONTRAST TECHNIQUE: Multidetector CT imaging of the abdomen and pelvis was performed following the standard protocol without IV contrast. COMPARISON:  08/02/2017 FINDINGS: Lower chest: Dependent atelectasis in the lung bases. Heart is normal size. No effusions. Hepatobiliary: Small hypodensity is stable since prior in the right hepatic dome study, likely small cyst. Gallbladder unremarkable. Pancreas: No focal abnormality or ductal dilatation. Spleen: No focal abnormality.  Normal size. Adrenals/Urinary Tract: Right hydronephrosis due to a punctate 2 mm a proximal right ureteral stone. Punctate nonobstructing stone in the midpole of the right kidney. No stones or hydronephrosis on the left. Urinary bladder and adrenal glands unremarkable. Stomach/Bowel: Sigmoid diverticulosis. No active diverticulitis. Moderate stool in the right colon. Appendix normal. Stomach and small bowel decompressed, unremarkable. Vascular/Lymphatic: Aortic atherosclerosis. No enlarged abdominal or pelvic lymph nodes. Reproductive: No visible focal abnormality. Other: No free fluid or free air. Musculoskeletal: No acute bony abnormality. Degenerative disc and facet disease in the lower lumbar spine with bilateral L5 pars defects and grade 1 anterolisthesis at L5-S1. IMPRESSION: Punctate 2 mm proximal right ureteral stone with mild right hydronephrosis. Sigmoid diverticulosis.  No active diverticulitis. Aortic atherosclerosis. Electronically Signed   By: Rolm Baptise M.D.   On: 09/14/2019 11:07    Procedures Procedures  (including critical care time)  Medications Ordered in ED Medications  sodium chloride 0.9 % bolus 1,000 mL (0 mLs Intravenous Stopped 09/14/19 1139)  morphine 4 MG/ML injection 4 mg (4 mg Intravenous Given 09/14/19 0938)  ondansetron (ZOFRAN) injection 4 mg (4 mg Intravenous Given 09/14/19 0938)  ketorolac (TORADOL) 15 MG/ML injection 15 mg (15 mg Intravenous Given 09/14/19 1304)    ED Course  I have reviewed the triage vital signs and the nursing notes.  Pertinent labs & imaging results that were available during my care of the patient were reviewed by me and considered in my medical decision making (see chart for details).    MDM Rules/Calculators/A&P                      Patient here for evaluation of abdominal pain, vomiting. He is uncomfortable appearing on evaluation. CT scan demonstrates  right ureteral stone. Urinalysis is not consistent with UTI. Renal function is within normal limits. Following treatment in the emergency department he is feeling improved. Discussed with patient home care for kidney stones. Discussed outpatient follow-up and return precautions.  Malena Catholiclbert J Furey was evaluated in Emergency Department on 09/14/2019 for the symptoms described in the history of present illness. He was evaluated in the context of the global COVID-19 pandemic, which necessitated consideration that the patient might be at risk for infection with the SARS-CoV-2 virus that causes COVID-19. Institutional protocols and algorithms that pertain to the evaluation of patients at risk for COVID-19 are in a state of rapid change based on information released by regulatory bodies including the CDC and federal and state organizations. These policies and algorithms were followed during the patient's care in the ED.    Final Clinical Impression(s) / ED Diagnoses Final diagnoses:  Kidney stone    Rx / DC Orders ED Discharge Orders         Ordered    HYDROcodone-acetaminophen (NORCO/VICODIN) 5-325  MG tablet  Every 6 hours PRN     09/14/19 1353    tamsulosin (FLOMAX) 0.4 MG CAPS capsule  Daily     09/14/19 1353           Tilden Fossaees, Gola Bribiesca, MD 09/14/19 1552

## 2019-09-16 ENCOUNTER — Telehealth (HOSPITAL_COMMUNITY): Payer: Self-pay | Admitting: Emergency Medicine

## 2019-09-16 LAB — URINE CULTURE: Culture: NO GROWTH

## 2019-09-16 MED ORDER — OXYCODONE-ACETAMINOPHEN 5-325 MG PO TABS: 1.0000 | ORAL_TABLET | Freq: Four times a day (QID) | ORAL | 0 refills | Status: DC | PRN

## 2020-03-05 ENCOUNTER — Encounter (HOSPITAL_COMMUNITY): Payer: Self-pay

## 2020-03-05 DIAGNOSIS — M5412 Radiculopathy, cervical region: Secondary | ICD-10-CM | POA: Insufficient documentation

## 2020-03-05 NOTE — ED Triage Notes (Signed)
Patient arrived stating that he thinks he has a pinched nerve in his neck. Reports he had been moving and could have hurt it then, that the pain started on Wednesday. States he was seen by his primary care doctor who took x-rays. Reports the pain moves into the right shoulder. Taking muscle relaxer's and prednisone with no relief.

## 2020-03-06 ENCOUNTER — Emergency Department (HOSPITAL_COMMUNITY)
Admission: EM | Admit: 2020-03-06 | Discharge: 2020-03-06 | Disposition: A | Discharge status: Home / Self Care | Payer: Self-pay | Attending: Emergency Medicine | Admitting: Emergency Medicine

## 2020-03-06 ENCOUNTER — Emergency Department (HOSPITAL_COMMUNITY): Payer: Self-pay

## 2020-03-06 DIAGNOSIS — M5412 Radiculopathy, cervical region: Secondary | ICD-10-CM

## 2020-03-06 MED ORDER — HYDROMORPHONE HCL 1 MG/ML IJ SOLN
1.0000 mg | Freq: Once | INTRAMUSCULAR | Status: AC
  Administered 2020-03-06: 1 mg via INTRAMUSCULAR
  Filled 2020-03-06: qty 1

## 2020-03-06 MED ORDER — KETOROLAC TROMETHAMINE 30 MG/ML IJ SOLN
30.0000 mg | Freq: Once | INTRAMUSCULAR | Status: AC
  Administered 2020-03-06: 30 mg via INTRAMUSCULAR
  Filled 2020-03-06: qty 1

## 2020-03-06 MED ORDER — CYCLOBENZAPRINE HCL 10 MG PO TABS: 10.0000 mg | ORAL_TABLET | Freq: Two times a day (BID) | ORAL | 0 refills | Status: DC | PRN

## 2020-03-06 MED ORDER — DIAZEPAM 2 MG PO TABS
2.0000 mg | ORAL_TABLET | Freq: Once | ORAL | Status: AC
  Administered 2020-03-06: 2 mg via ORAL
  Filled 2020-03-06: qty 1

## 2020-03-06 MED ORDER — METHYLPREDNISOLONE 4 MG PO TBPK: ORAL_TABLET | ORAL | 0 refills | Status: DC

## 2020-03-06 NOTE — ED Provider Notes (Signed)
[name]-EMERGENCY DEPT Provider Note   CSN: [id number] Arrival date & time: 03/05/20  2318     History Chief Complaint  Patient presents with  . Neck Pain    Marcus Roberson is a 59 y.o. male.  Patient presents with a 1 week history of left shoulder, arm and neck pain.  He states he may have a pinched nerve.  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  Denies any specific pain during lifting up.  He has ongoing pain for the past 3 days and unable to sleep.  He was seen at the urgent care and had x-rays at he told had "a little bit of arthritis".  He was given Robaxin and prednisone which he reports no relief.  He stopped these on his own several days ago.  He comes in tonight with severe pain to his right upper back, shoulder and arm.  Denies any pain in the neck itself.  No fever.  Pain with range of motion of the head worse turning towards the right than the left.  He denies any weakness in the arm.  No numbness or tingling.  No fever.  No chest pain or shortness of breath.  No abdominal pain.  No headache or visual changes.  Wife now at bedside provides additional history.  States that she gave the patient some of her prednisone that she has for her rheumatoid arthritis.  Patient also took several Percocet that he had leftover from a kidney stone prescription.  States the Percocet is not helping.  He was seen at urgent care where they gave him prednisone as well as methocarbamol.  The history is provided by the patient.  Neck Pain Associated symptoms: no chest pain, no fever, no headaches, no numbness and no weakness        History reviewed. No pertinent past medical history.  There are no problems to display for this patient.   History reviewed. No pertinent surgical history.     No family history on file.  Social History   Tobacco Use  . Smoking status: Never Smoker  . Smokeless  tobacco: Never Used  Vaping Use  . Vaping Use: Never used  Substance Use Topics  . Alcohol use: No  . Drug use: No    Home Medications Prior to Admission medications   Medication Sig Start Date End Date Taking? Authorizing Provider  cetirizine (ZYRTEC ALLERGY) 10 MG tablet Take 10 mg by mouth daily.     [provider]  dicyclomine (BENTYL) 20 MG tablet Take 1 tablet 3 x  /day as needed for Nausea, Bloating, Abdominal Cramping or Aching Patient taking differently: Take 20 mg by mouth 3 (three) times daily as needed (nausea, bloating, abominal cramping or aching).  07/14/19   [name], MD  fluticasone (FLONASE) 50 MCG/ACT nasal spray Place 1 spray into both nostrils daily.    [provider]  HYDROcodone-acetaminophen (NORCO/VICODIN) 5-325 MG tablet Take 1 tablet by mouth every 6 (six) hours as needed. 09/14/19   Tilden Fossa, MD  oxyCODONE-acetaminophen (PERCOCET/ROXICET) 5-325 MG tablet Take 1 tablet by mouth every 6 (six) hours as needed for severe pain. 09/16/19   Tilden Fossa, MD  tamsulosin (FLOMAX) 0.4 MG CAPS capsule Take 1 capsule (0.4 mg total) by mouth daily. 09/14/19   Tilden Fossa, MD    Allergies    Codeine  Review of Systems  Review of Systems  Constitutional: Negative for activity change, appetite change and fever.  HENT: Negative for congestion and rhinorrhea.   Respiratory: Negative for cough, chest tightness and shortness of breath.   Cardiovascular: Negative for chest pain and leg swelling.  Gastrointestinal: Negative for abdominal pain, nausea and vomiting.  Genitourinary: Negative for dysuria and hematuria.  Musculoskeletal: Positive for arthralgias, myalgias and neck pain.  Skin: Negative for rash.  Neurological: Negative for dizziness, weakness, numbness and headaches.   all other systems are negative except as noted in the HPI and PMH.    Physical Exam Updated Vital Signs BP 125/73 (BP Location: Left Arm)   Pulse (!)  51   Temp 97.9 F (36.6 C) (Oral)   Resp 17   Ht 6\' 1"  (1.854 m)   Wt 83.9 kg   SpO2 98%   BMI 24.41 kg/m   Physical Exam Vitals and nursing note reviewed.  Constitutional:      General: He is not in acute distress.    Appearance: He is well-developed.     Comments: Uncomfortable appearing  HENT:     Head: Normocephalic and atraumatic.     Mouth/Throat:     Pharynx: No oropharyngeal exudate.  Eyes:     Conjunctiva/sclera: Conjunctivae normal.     Pupils: Pupils are equal, round, and reactive to light.  Neck:     Comments: No meningismus. Right paraspinal cervical tenderness.  Pain with range of motion. Cardiovascular:     Rate and Rhythm: Normal rate and regular rhythm.     Heart sounds: Normal heart sounds. No murmur heard.   Pulmonary:     Effort: Pulmonary effort is normal. No respiratory distress.     Breath sounds: Normal breath sounds.  Abdominal:     Palpations: Abdomen is soft.     Tenderness: There is no abdominal tenderness. There is no guarding or rebound.  Musculoskeletal:        General: Tenderness present. Normal range of motion.     Cervical back: Normal range of motion. Tenderness present.     Comments: Right paraspinal cervical and trapezius tenderness.  Spasm present.  Equal radial pulses and grip strength bilaterally.  Full range of motion of upper extremities bilaterally.  Equal strength and sensation bilaterally. Able to range shoulder behind head and touch opposite shoulder.  Skin:    General: Skin is warm.  Neurological:     Mental Status: He is alert and oriented to person, place, and time.     Cranial Nerves: No cranial nerve deficit.     Motor: No abnormal muscle tone.     Coordination: Coordination normal.     Comments: No ataxia on finger to nose bilaterally. No pronator drift. 5/5 strength throughout. CN 2-12 intact.Equal grip strength. Sensation intact.   Psychiatric:        Behavior: Behavior normal.     ED Results / Procedures /  Treatments   Labs (all labs ordered are listed, but only abnormal results are displayed) Labs Reviewed - No data to display  EKG None  Radiology CT Cervical Spine Wo Contrast  Result Date: 03/06/2020 CLINICAL DATA:  Initial evaluation for cervical radiculopathy. EXAM: CT CERVICAL SPINE WITHOUT CONTRAST TECHNIQUE: Multidetector CT imaging of the cervical spine was performed without intravenous contrast. Multiplanar CT image reconstructions were also generated. COMPARISON:  Prior CT from 08/03/2017. FINDINGS: Alignment: Examination degraded by motion artifact. Straightening of the normal cervical lordosis.  No listhesis. Skull base and vertebrae: Visualized skull base  grossly intact. Normal C1-2 articulations are preserved. Vertebral body heights maintained. No acute fracture. No acute discrete or worrisome osseous lesions. Soft tissues and spinal canal: No visible soft tissue abnormality within the neck. Disc levels: C2-3: Negative interspace. Left-sided facet hypertrophy. No canal or foraminal stenosis. C3-4: Chronic intervertebral disc space narrowing with diffuse disc bulge. Right greater than left uncovertebral hypertrophy with endplate spurring. Broad posterior disc osteophyte flattens the ventral thecal sac, greater on the right. Mild spinal stenosis with moderate to severe right C4 foraminal narrowing. No left foraminal encroachment. C4-5: Degenerative intervertebral disc space narrowing with mild diffuse disc bulge and uncovertebral hypertrophy. Mild flattening of the ventral thecal sac without significant spinal stenosis. Mild to moderate right C5 foraminal stenosis. No significant left foraminal narrowing. C5-6: Chronic intervertebral disc space narrowing with diffuse degenerative disc osteophyte. Superimposed broad-based right paracentral to subarticular disc protrusion flattens the ventral thecal sac, greater on the right. Secondary mild spinal stenosis with suspected cord flattening.  Superimposed uncovertebral hypertrophy with moderate right and mild left C6 foraminal narrowing. C6-7: Chronic intervertebral disc space narrowing with diffuse degenerative disc osteophyte. Broad posterior component flattens the ventral thecal sac and resultant mild-to-moderate spinal stenosis. Moderate right worse than left C7 foraminal narrowing. C7-T1: Negative interspace. Left greater than right facet hypertrophy. No canal or foraminal stenosis. Upper chest: Unremarkable. Other: None. IMPRESSION: 1. No acute osseous abnormality within the cervical spine. 2. Right paracentral to subarticular disc protrusion at C5-6 with resultant mild canal with moderate right C6 foraminal stenosis. Query right C6 radiculitis. 3. Degenerative disc osteophyte at C6-7 with resultant mild to moderate spinal stenosis with moderate right worse than left C7 foraminal narrowing. 4. Right eccentric disc osteophyte at C3-4 with resultant mild canal and moderate to severe right C4 foraminal stenosis. Electronically Signed   By: Jeannine Boga M.D.   On: 03/06/2020 04:11    Procedures Procedures (including critical care time)  Medications Ordered in ED Medications  HYDROmorphone (DILAUDID) injection 1 mg (has no administration in time range)  diazepam (VALIUM) tablet 2 mg (has no administration in time range)  ketorolac (TORADOL) 30 MG/ML injection 30 mg (has no administration in time range)    ED Course  I have reviewed the triage vital signs and the nursing notes.  Pertinent labs & imaging results that were available during my care of the patient were reviewed by me and considered in my medical decision making (see chart for details).    MDM Rules/Calculators/A&P                          1 week of ongoing upper back and neck pain and shoulder pain.  Suspect cervical radiculopathy.  He is neurovascularly intact.  Equal strength and sensation.  CT scan shows paracentral disc protrusion at C5/6 with moderate  canal stenosis.  Possible C6 radiculitis.  CT results D/w NP Meyran of neurosurgery. She agrees with medrol dosepak and outpatient followup.   Discussed with patient and wife.  Advised not to take medications are not prescribed to him.  Will initiate course of Solu-Medrol for longer taper. Referral to neurosurgery given.  Will change muscle relaxer to cyclobenzaprine.  Patient cautioned not to drive or operate heavy machinery while on these medications.  Follow-up with neurosurgery.  Return to the ED with worsening pain, weakness, numbness, tingling, other concerns. Final Clinical Impression(s) / ED Diagnoses Final diagnoses:  Cervical radiculopathy    Rx / DC Orders ED Discharge Orders  None       Glynn Octave, MD 03/06/20 [phone]

## 2020-03-06 NOTE — ED Notes (Signed)
Patient educated about not driving or performing other critical tasks (such as operating heavy machinery, caring for infant/toddler/child) due to sedative nature of narcotic medications received while in the ED.  Pt/caregiver verbalized understanding.   

## 2020-03-06 NOTE — ED Notes (Signed)
Patient transported to CT 

## 2020-03-06 NOTE — Discharge Instructions (Signed)
Take the steroids and muscle relaxers as prescribed.  Do not take medications are not prescribed to you.  Follow-up with the neurosurgeon in the office.  Return to the ED with worsening pain, weakness, numbness, fever, other concerns.

## 2020-08-19 ENCOUNTER — Encounter: Payer: Self-pay | Admitting: Internal Medicine

## 2020-08-19 NOTE — Progress Notes (Signed)
   N  O       S  H  O  W  after confirmed appt                                                                                                                                                                                                                              This very nice 59 y.o.  MWM presents for a Screening /Preventative Visit & comprehensive evaluation and management of multiple medical co-morbidities.  Patient has been followed expectantly for labile elevated BP , abn lipids,  Abn glucose and Vitamin D Deficiency.     Patient's BP has been controlled at home.  Today's  . Patient denies any cardiac symptoms as chest pain, palpitations, shortness of breath, dizziness or ankle swelling.      Patient's hyperlipidemia is controlled with diet. Last lipids were at goal:  Lab Results  Component Value Date   CHOL 162 07/13/2019   HDL 48 07/13/2019   LDLCALC 92 07/13/2019   TRIG 128 07/13/2019   CHOLHDL 3.4 07/13/2019       Patient is monitored expectantly for glucose intolerance and patient denies reactive hypoglycemic symptoms, visual blurring, diabetic polys or paresthesias. Last A1c was normal & at goal:   Lab Results  Component Value Date   HGBA1C 5.1 07/13/2019         Finally, patient has history of Vitamin D Deficiency  ("25" /2020).   Current Outpatient Medications on File Prior to Visit  Medication Sig  . cetirizine  10 MG tablet Take daily.   Marland Kitchen dicyclomine 20 MG tablet Take 1 tablet 3 x  /day as needed   . FLONASE nasal spray Place 1 spray into both nostrils daily.  Marland Kitchen FLOMAX 0.4 MG  capsule Take 1 capsule daily.     Allergies  Allergen Reactions  . Codeine Other (See Comments)    Stomach ache    Health Maintenance  Topic Date Due  .  Hepatitis C Screening  Never done  . HIV Screening  Never done  . COLONOSCOPY  Never done  . INFLUENZA VACCINE  Never done  . TETANUS/TDAP  07/12/2029   Immunization History  Administered Date(s) Administered  . PPD Test 07/13/2019  . Tdap 07/13/2019   Last Colon - Never - Last year agreed to do Cologard, but Never completed.

## 2020-08-20 ENCOUNTER — Ambulatory Visit: Payer: Self-pay | Admitting: Internal Medicine

## 2020-08-20 DIAGNOSIS — Z5329 Procedure and treatment not carried out because of patient's decision for other reasons: Secondary | ICD-10-CM

## 2020-08-20 DIAGNOSIS — R030 Elevated blood-pressure reading, without diagnosis of hypertension: Secondary | ICD-10-CM

## 2020-08-20 DIAGNOSIS — E559 Vitamin D deficiency, unspecified: Secondary | ICD-10-CM

## 2020-08-20 DIAGNOSIS — R5383 Other fatigue: Secondary | ICD-10-CM

## 2020-08-20 DIAGNOSIS — Z13220 Encounter for screening for lipoid disorders: Secondary | ICD-10-CM

## 2020-08-20 DIAGNOSIS — Z8249 Family history of ischemic heart disease and other diseases of the circulatory system: Secondary | ICD-10-CM

## 2020-08-20 DIAGNOSIS — R7309 Other abnormal glucose: Secondary | ICD-10-CM

## 2020-08-30 ENCOUNTER — Other Ambulatory Visit: Payer: Self-pay | Admitting: Internal Medicine

## 2020-09-28 DIAGNOSIS — Z0000 Encounter for general adult medical examination without abnormal findings: Secondary | ICD-10-CM | POA: Insufficient documentation

## 2020-09-28 DIAGNOSIS — E559 Vitamin D deficiency, unspecified: Secondary | ICD-10-CM | POA: Insufficient documentation

## 2020-09-28 DIAGNOSIS — E538 Deficiency of other specified B group vitamins: Secondary | ICD-10-CM | POA: Insufficient documentation

## 2020-09-28 NOTE — Progress Notes (Signed)
Complete Physical  Assessment and Plan:  Marcus Roberson was seen today for annual exam.  Diagnoses and all orders for this visit:  Encounter for routine adult health examination without abnormal findings  BMI 25 Continue to recommend diet heavy in fruits and veggies and low in animal meats, cheeses, and dairy products, appropriate calorie intake Discuss exercise recommendations routinely Continue to monitor weight at each visit  Vitamin D deficiency -     VITAMIN D 25 Hydroxy (Vit-D Deficiency, Fractures)  B12 deficiency -     Vitamin B12  Medication management -     CBC with Differential/Platelet -     COMPLETE METABOLIC PANEL WITH GFR -     Magnesium -     Urinalysis, Routine w reflex microscopic  Screening for hematuria or proteinuria -     Urinalysis, Routine w reflex microscopic  Screening cholesterol level -     Lipid panel  Screening for prostate cancer -     PSA  Screening for colon cancer -     Ambulatory referral to Gastroenterology  Screening for cardiovascular condition -     EKG 12-Lead  Screening for thyroid disorder -     TSH  Screening for diabetes mellitus Family history of diabetes mellitus Discussed disease and risks Discussed diet/exercise, weight management  -     Hemoglobin A1c  Discussed med's effects and SE's. Screening labs and tests as requested with regular follow-up as recommended. Over 40 minutes of exam, counseling, chart review and critical decision making was performed  Future Appointments  Date Time Provider Department Center  10/07/2021  2:00 PM [name], MD [facility] None    HPI 60 y.o. Male patient presents for a complete physical. He has Encounter for routine adult health examination without abnormal findings; Vitamin D deficiency; and B12 deficiency on their problem list.   He is married, 2 grown children, 6 grandchildren, mostly local, moved closely. Owns a plumbing business.   Has been seen  intermittent/infrequently due to insurance/financial, has new insurance this year.   Takes zyrtec and nasal sprays for seasonal allergies, prednisone taper works well for occasional bronchitis, typically in the Spring.   Reports hx of 1 renal stone, passed spontaneously with tamsulosin.   BMI is Body mass index is 26.52 kg/m., he has been working on diet, avoids sweets, but admits eats a bit of fast food and does drink sweet tea, no soda, tries to get veggies, works a physically active job.  Wt Readings from Last 3 Encounters:  10/01/20 201 lb (91.2 kg)  03/05/20 185 lb (83.9 kg)  07/13/19 195 lb 3.2 oz (88.5 kg)    Today their BP is BP: 108/64 He does not workout, physically active job. He denies chest pain, shortness of breath, dizziness.   He is not on cholesterol medication. His cholesterol is at goal. The cholesterol last visit was:   Lab Results  Component Value Date   CHOL 162 07/13/2019   HDL 48 07/13/2019   LDLCALC 92 07/13/2019   TRIG 128 07/13/2019   CHOLHDL 3.4 07/13/2019   He has been working on diet and exercise for glucose management, denies increased appetite, nausea, paresthesia of the feet, polydipsia, polyuria, visual disturbances and vomiting. Last A1C in the office was:  Lab Results  Component Value Date   HGBA1C 5.1 07/13/2019   Last GFR: Lab Results  Component Value Date   GFRNONAA >60 09/14/2019   Patient is on Vitamin D supplement.   Lab Results  Component Value  Date   VD25OH 25 (L) 07/13/2019     Last PSA was: Lab Results  Component Value Date   PSA 0.2 07/13/2019    Lab Results  Component Value Date   VITAMINB12 300 07/13/2019     Current Medications:  Current Outpatient Medications on File Prior to Visit  Medication Sig Dispense Refill  . cetirizine (ZYRTEC) 10 MG tablet Take 10 mg by mouth daily.     Marland Kitchen dicyclomine (BENTYL) 20 MG tablet TAKE 1 TABLET BY MOUTH THREE TIMES DAILY AS NEEDED FOR NAUSEA, BLOATING, AND ABDOMINAL  CRAMPING/ACHING 90 tablet 1  . fluticasone (FLONASE) 50 MCG/ACT nasal spray Place 1 spray into both nostrils daily.     No current facility-administered medications on file prior to visit.   Allergies:  Allergies  Allergen Reactions  . Codeine Other (See Comments)    Stomach ache   Health Maintenance:  Immunization History  Administered Date(s) Administered  . PPD Test 07/13/2019  . Tdap 07/13/2019    Tetanus: 06/2019 Pneumovax: due age 2 Flu vaccine: declines Shingrix: declines Covid 19: declines   Colonoscopy: never had, newly has insurance  EGD:  Eye Exam: remote, no problems, uses readers  Dentist: Last 2021  Patient Care Team: [name], MD as PCP - General (Internal Medicine)  Medical History:  has Encounter for routine adult health examination without abnormal findings; Vitamin D deficiency; and B12 deficiency on their problem list. Surgical History:  He  has a past surgical history that includes No past surgeries. Family History:  His family history includes Breast cancer in his mother; Cancer (age of onset: 82) in his sister; Dementia in his mother; Heart disease in his maternal grandfather; Lung cancer in his mother; Lymphoma in his mother; Parkinson's disease in his paternal uncle. Social History:   reports that he has never smoked. He has never used smokeless tobacco. He reports that he does not drink alcohol and does not use drugs.   Review of Systems:  Review of Systems  Constitutional: Negative for malaise/fatigue and weight loss.  HENT: Negative for hearing loss and tinnitus.   Eyes: Negative for blurred vision and double vision.  Respiratory: Negative for cough, shortness of breath and wheezing.   Cardiovascular: Negative for chest pain, palpitations, orthopnea, claudication and leg swelling.  Gastrointestinal: Negative for abdominal pain, blood in stool, constipation, diarrhea, heartburn, melena, nausea and vomiting.  Genitourinary:  Negative.   Musculoskeletal: Negative for joint pain and myalgias.  Skin: Negative for rash.  Neurological: Negative for dizziness, tingling, sensory change, weakness and headaches.  Endo/Heme/Allergies: Negative for polydipsia.  Psychiatric/Behavioral: Negative.   All other systems reviewed and are negative.   Physical Exam: Estimated body mass index is 26.52 kg/m as calculated from the following:   Height as of this encounter: 6\' 1"  (1.854 m).   Weight as of this encounter: 201 lb (91.2 kg). BP 108/64   Pulse 75   Temp 97.7 F (36.5 C)   Ht 6\' 1"  (1.854 m)   Wt 201 lb (91.2 kg)   SpO2 98%   BMI 26.52 kg/m  General Appearance: Well nourished, in no apparent distress.  Eyes: PERRLA, EOMs, conjunctiva no swelling or erythema Sinuses: No Frontal/maxillary tenderness  ENT/Mouth: Ext aud canals clear, normal light reflex with TMs without erythema, bulging. Good dentition. No erythema, swelling, or exudate on post pharynx. Tonsils not swollen or erythematous. Hearing normal.  Neck: Supple, thyroid normal. No bruits  Respiratory: Respiratory effort normal, BS equal bilaterally without rales, rhonchi, wheezing  or stridor.  Cardio: RRR without murmurs, rubs or gallops. Brisk peripheral pulses without edema.  Chest: symmetric, with normal excursions and percussion.  Abdomen: Soft, nontender, no guarding, rebound, hernias, masses, or organomegaly.  Lymphatics: Non tender without lymphadenopathy.  Genitourinary: no concerns, does regular self checks, declines Musculoskeletal: Full ROM all peripheral extremities,5/5 strength, and normal gait.  Skin: Warm, dry without rashes, lesions, ecchymosis. Neuro: Cranial nerves intact, reflexes equal bilaterally. Normal muscle tone, no cerebellar symptoms. Sensation intact.  Psych: Awake and oriented X 3, normal affect, Insight and Judgment appropriate.   EKG: Sinus bradycardia, no ST changes  Gorden Harms Onie Kasparek 5:11 PM [facility]

## 2020-10-01 ENCOUNTER — Other Ambulatory Visit: Payer: Self-pay

## 2020-10-01 ENCOUNTER — Encounter: Payer: Self-pay | Admitting: Adult Health

## 2020-10-01 ENCOUNTER — Ambulatory Visit (INDEPENDENT_AMBULATORY_CARE_PROVIDER_SITE_OTHER): Payer: 59 | Admitting: Adult Health

## 2020-10-01 VITALS — BP 108/64 | HR 75 | Temp 97.7°F | Ht 73.0 in | Wt 201.0 lb

## 2020-10-01 DIAGNOSIS — Z136 Encounter for screening for cardiovascular disorders: Secondary | ICD-10-CM

## 2020-10-01 DIAGNOSIS — N401 Enlarged prostate with lower urinary tract symptoms: Secondary | ICD-10-CM | POA: Diagnosis not present

## 2020-10-01 DIAGNOSIS — R030 Elevated blood-pressure reading, without diagnosis of hypertension: Secondary | ICD-10-CM

## 2020-10-01 DIAGNOSIS — Z0000 Encounter for general adult medical examination without abnormal findings: Secondary | ICD-10-CM | POA: Diagnosis not present

## 2020-10-01 DIAGNOSIS — Z833 Family history of diabetes mellitus: Secondary | ICD-10-CM

## 2020-10-01 DIAGNOSIS — Z1389 Encounter for screening for other disorder: Secondary | ICD-10-CM

## 2020-10-01 DIAGNOSIS — Z13220 Encounter for screening for lipoid disorders: Secondary | ICD-10-CM

## 2020-10-01 DIAGNOSIS — Z6825 Body mass index (BMI) 25.0-25.9, adult: Secondary | ICD-10-CM

## 2020-10-01 DIAGNOSIS — R350 Frequency of micturition: Secondary | ICD-10-CM

## 2020-10-01 DIAGNOSIS — Z79899 Other long term (current) drug therapy: Secondary | ICD-10-CM | POA: Diagnosis not present

## 2020-10-01 DIAGNOSIS — Z131 Encounter for screening for diabetes mellitus: Secondary | ICD-10-CM | POA: Diagnosis not present

## 2020-10-01 DIAGNOSIS — Z1329 Encounter for screening for other suspected endocrine disorder: Secondary | ICD-10-CM

## 2020-10-01 DIAGNOSIS — E559 Vitamin D deficiency, unspecified: Secondary | ICD-10-CM | POA: Diagnosis not present

## 2020-10-01 DIAGNOSIS — E538 Deficiency of other specified B group vitamins: Secondary | ICD-10-CM

## 2020-10-01 DIAGNOSIS — Z1211 Encounter for screening for malignant neoplasm of colon: Secondary | ICD-10-CM

## 2020-10-01 DIAGNOSIS — Z6824 Body mass index (BMI) 24.0-24.9, adult: Secondary | ICD-10-CM

## 2020-10-01 DIAGNOSIS — Z130 Encounter for screening for diseases of the blood and blood-forming organs and certain disorders involving the immune mechanism: Secondary | ICD-10-CM

## 2020-10-01 DIAGNOSIS — Z125 Encounter for screening for malignant neoplasm of prostate: Secondary | ICD-10-CM | POA: Diagnosis not present

## 2020-10-01 NOTE — Patient Instructions (Addendum)
Mr. Marcus Roberson , Thank you for taking time to come for your Annual Wellness Visit. I appreciate your ongoing commitment to your health goals. Please review the following plan we discussed and let me know if I can assist you in the future.   This is a list of the screening recommended for you and due dates:  Health Maintenance  Topic Date Due  . Colon Cancer Screening  Never done  . Flu Shot  12/20/2020*  .  Hepatitis C: One time screening is recommended by Center for Disease Control  (CDC) for  adults born from [birth year] through 1964.   10/01/2021*  . Tetanus Vaccine  07/12/2029  . HIV Screening  Discontinued  *Topic was postponed. The date shown is not the original due date.     Know what a healthy weight is for you (roughly BMI <25) and aim to maintain this  Aim for 7+ servings of fruits and vegetables daily  65-80+ fluid ounces of water or unsweet tea for healthy kidneys  Limit to max 1 drink of alcohol per day; avoid smoking/tobacco  Limit animal fats in diet for cholesterol and heart health - choose grass fed whenever available  Avoid highly processed foods, and foods high in saturated/trans fats  Aim for low stress - take time to unwind and care for your mental health  Aim for 150 min of moderate intensity exercise weekly for heart health, and weights twice weekly for bone health  Aim for 7-9 hours of sleep daily        High-Fiber Eating Plan Fiber, also called dietary fiber, is a type of carbohydrate. It is found foods such as fruits, vegetables, whole grains, and beans. A high-fiber diet can have many health benefits. Your health care provider may recommend a high-fiber diet to help:  Prevent constipation. Fiber can make your bowel movements more regular.  Lower your cholesterol.  Relieve the following conditions: ? Inflammation of veins in the anus (hemorrhoids). ? Inflammation of specific areas of the digestive tract (uncomplicated diverticulosis). ? A problem of  the large intestine, also called the colon, that sometimes causes pain and diarrhea (irritable bowel syndrome, or IBS).  Prevent overeating as part of a weight-loss plan.  Prevent heart disease, type 2 diabetes, and certain cancers. What are tips for following this plan? Reading food labels  Check the nutrition facts label on food products for the amount of dietary fiber. Choose foods that have 5 grams of fiber or more per serving.  The goals for recommended daily fiber intake include: ? Men (age 51 or younger): 34-38 g. ? Men (over age 72): 28-34 g. ? Women (age 13 or younger): 25-28 g. ? Women (over age 35): 22-25 g. Your daily fiber goal is _____________ g.   Shopping  Choose whole fruits and vegetables instead of processed forms, such as apple juice or applesauce.  Choose a wide variety of high-fiber foods such as avocados, lentils, oats, and kidney beans.  Read the nutrition facts label of the foods you choose. Be aware of foods with added fiber. These foods often have high sugar and sodium amounts per serving. Cooking  Use whole-grain flour for baking and cooking.  Cook with brown rice instead of white rice. Meal planning  Start the day with a breakfast that is high in fiber, such as a cereal that contains 5 g of fiber or more per serving.  Eat breads and cereals that are made with whole-grain flour instead of refined flour or white  flour.  Eat brown rice, bulgur wheat, or millet instead of white rice.  Use beans in place of meat in soups, salads, and pasta dishes.  Be sure that half of the grains you eat each day are whole grains. General information  You can get the recommended daily intake of dietary fiber by: ? Eating a variety of fruits, vegetables, grains, nuts, and beans. ? Taking a fiber supplement if you are not able to take in enough fiber in your diet. It is better to get fiber through food than from a supplement.  Gradually increase how much fiber you  consume. If you increase your intake of dietary fiber too quickly, you may have bloating, cramping, or gas.  Drink plenty of water to help you digest fiber.  Choose high-fiber snacks, such as berries, raw vegetables, nuts, and popcorn. What foods should I eat? Fruits Berries. Pears. Apples. Oranges. Avocado. Prunes and raisins. Dried figs. Vegetables Sweet potatoes. Spinach. Kale. Artichokes. Cabbage. Broccoli. Cauliflower. Green peas. Carrots. Squash. Grains Whole-grain breads. Multigrain cereal. Oats and oatmeal. Brown rice. Barley. Bulgur wheat. Millet. Quinoa. Bran muffins. Popcorn. Rye wafer crackers. Meats and other proteins Navy beans, kidney beans, and pinto beans. Soybeans. Split peas. Lentils. Nuts and seeds. Dairy Fiber-fortified yogurt. Beverages Fiber-fortified soy milk. Fiber-fortified orange juice. Other foods Fiber bars. The items listed above may not be a complete list of recommended foods and beverages. Contact a dietitian for more information. What foods should I avoid? Fruits Fruit juice. Cooked, strained fruit. Vegetables Fried potatoes. Canned vegetables. Well-cooked vegetables. Grains White bread. Pasta made with refined flour. White rice. Meats and other proteins Fatty cuts of meat. Fried chicken or fried fish. Dairy Milk. Yogurt. Cream cheese. Sour cream. Fats and oils Butters. Beverages Soft drinks. Other foods Cakes and pastries. The items listed above may not be a complete list of foods and beverages to avoid. Talk with your dietitian about what choices are best for you. Summary  Fiber is a type of carbohydrate. It is found in foods such as fruits, vegetables, whole grains, and beans.  A high-fiber diet has many benefits. It can help to prevent constipation, lower blood cholesterol, aid weight loss, and reduce your risk of heart disease, diabetes, and certain cancers.  Increase your intake of fiber gradually. Increasing fiber too quickly may  cause cramping, bloating, and gas. Drink plenty of water while you increase the amount of fiber you consume.  The best sources of fiber include whole fruits and vegetables, whole grains, nuts, seeds, and beans. This information is not intended to replace advice given to you by your health care provider. Make sure you discuss any questions you have with your health care provider. Document Revised: 01/12/2020 Document Reviewed: 01/12/2020 Elsevier Patient Education  2021 [employer].

## 2020-10-02 ENCOUNTER — Encounter: Payer: Self-pay | Admitting: Adult Health

## 2020-10-02 DIAGNOSIS — N182 Chronic kidney disease, stage 2 (mild): Secondary | ICD-10-CM | POA: Insufficient documentation

## 2020-10-02 DIAGNOSIS — E785 Hyperlipidemia, unspecified: Secondary | ICD-10-CM | POA: Insufficient documentation

## 2020-10-02 LAB — CBC WITH DIFFERENTIAL/PLATELET
Absolute Monocytes: 525 cells/uL (ref 200–950)
Basophils Absolute: 69 cells/uL (ref 0–200)
Basophils Relative: 0.8 %
Eosinophils Absolute: 490 cells/uL (ref 15–500)
Eosinophils Relative: 5.7 %
HCT: 42 % (ref 38.5–50.0)
Hemoglobin: 14.5 g/dL (ref 13.2–17.1)
Lymphs Abs: 1393 cells/uL (ref 850–3900)
MCH: 30.5 pg (ref 27.0–33.0)
MCHC: 34.5 g/dL (ref 32.0–36.0)
MCV: 88.2 fL (ref 80.0–100.0)
MPV: 12 fL (ref 7.5–12.5)
Monocytes Relative: 6.1 %
Neutro Abs: 6123 cells/uL (ref 1500–7800)
Neutrophils Relative %: 71.2 %
Platelets: 245 10*3/uL (ref 140–400)
RBC: 4.76 10*6/uL (ref 4.20–5.80)
RDW: 12.8 % (ref 11.0–15.0)
Total Lymphocyte: 16.2 %
WBC: 8.6 10*3/uL (ref 3.8–10.8)

## 2020-10-02 LAB — COMPLETE METABOLIC PANEL WITH GFR
AG Ratio: 2 (calc) (ref 1.0–2.5)
ALT: 12 U/L (ref 9–46)
AST: 14 U/L (ref 10–35)
Albumin: 4 g/dL (ref 3.6–5.1)
Alkaline phosphatase (APISO): 53 U/L (ref 35–144)
BUN: 13 mg/dL (ref 7–25)
CO2: 27 mmol/L (ref 20–32)
Calcium: 9.4 mg/dL (ref 8.6–10.3)
Chloride: 107 mmol/L (ref 98–110)
Creat: 1.22 mg/dL (ref 0.70–1.33)
GFR, Est African American: 75 mL/min/{1.73_m2} (ref >=60)
GFR, Est Non African American: 65 mL/min/{1.73_m2} (ref >=60)
Globulin: 2 g/dL (calc) (ref 1.9–3.7)
Glucose, Bld: 72 mg/dL (ref 65–99)
Potassium: 4.1 mmol/L (ref 3.5–5.3)
Sodium: 142 mmol/L (ref 135–146)
Total Bilirubin: 0.5 mg/dL (ref 0.2–1.2)
Total Protein: 6 g/dL — ABNORMAL LOW (ref 6.1–8.1)

## 2020-10-02 LAB — URINALYSIS, ROUTINE W REFLEX MICROSCOPIC
Bilirubin Urine: NEGATIVE
Glucose, UA: NEGATIVE
Hgb urine dipstick: NEGATIVE
Ketones, ur: NEGATIVE
Leukocytes,Ua: NEGATIVE
Nitrite: NEGATIVE
Protein, ur: NEGATIVE
Specific Gravity, Urine: 1.021 (ref 1.001–1.03)
pH: <=5 (ref 5.0–8.0)

## 2020-10-02 LAB — LIPID PANEL
Cholesterol: 192 mg/dL (ref <200)
HDL: 48 mg/dL (ref >=40)
LDL Cholesterol (Calc): 107 mg/dL (calc) — ABNORMAL HIGH
Non-HDL Cholesterol (Calc): 144 mg/dL (calc) — ABNORMAL HIGH (ref <130)
Total CHOL/HDL Ratio: 4 (calc) (ref <5.0)
Triglycerides: 258 mg/dL — ABNORMAL HIGH (ref <150)

## 2020-10-02 LAB — HEMOGLOBIN A1C
Hgb A1c MFr Bld: 5.4 % of total Hgb (ref <5.7)
Mean Plasma Glucose: 108 mg/dL
eAG (mmol/L): 6 mmol/L

## 2020-10-02 LAB — PSA: PSA: 0.2 ng/mL (ref <=4.0)

## 2020-10-02 LAB — VITAMIN D 25 HYDROXY (VIT D DEFICIENCY, FRACTURES): Vit D, 25-Hydroxy: 29 ng/mL — ABNORMAL LOW (ref 30–100)

## 2020-10-02 LAB — TSH: TSH: 1.21 mIU/L (ref 0.40–4.50)

## 2020-10-02 LAB — VITAMIN B12: Vitamin B-12: 303 pg/mL (ref 200–1100)

## 2020-10-02 LAB — MAGNESIUM: Magnesium: 2.2 mg/dL (ref 1.5–2.5)

## 2020-12-05 ENCOUNTER — Encounter: Payer: Self-pay | Admitting: Internal Medicine

## 2021-01-24 ENCOUNTER — Other Ambulatory Visit: Payer: Self-pay

## 2021-01-24 ENCOUNTER — Ambulatory Visit (AMBULATORY_SURGERY_CENTER): Payer: Self-pay | Admitting: *Deleted

## 2021-01-24 VITALS — Ht 73.0 in | Wt 196.0 lb

## 2021-01-24 DIAGNOSIS — Z1211 Encounter for screening for malignant neoplasm of colon: Secondary | ICD-10-CM

## 2021-01-24 MED ORDER — NA SULFATE-K SULFATE-MG SULF 17.5-3.13-1.6 GM/177ML PO SOLN: ORAL | 0 refills | Status: AC

## 2021-01-24 NOTE — Progress Notes (Signed)
Patient is here in-person for PV. Patient denies any allergies to eggs or soy. Patient denies any past surgeries. Patient denies any oxygen use at home. Patient denies taking any diet/weight loss medications or blood thinners. Patient is not being treated for MRSA or C-diff. Patient is aware of our care-partner policy and Covid-19 safety protocol. EMMI education assigned to the patient for the procedure, sent to MyChart. Patient aware to call us if anything in his medical hx changes from the PV day.

## 2021-02-07 ENCOUNTER — Encounter: Payer: 59 | Admitting: Internal Medicine

## 2021-03-04 ENCOUNTER — Other Ambulatory Visit: Payer: Self-pay | Admitting: Internal Medicine

## 2021-03-29 ENCOUNTER — Encounter: Payer: 59 | Admitting: Internal Medicine

## 2021-09-03 ENCOUNTER — Encounter: Payer: 59 | Admitting: Internal Medicine

## 2021-10-06 ENCOUNTER — Encounter: Payer: Self-pay | Admitting: Internal Medicine

## 2021-10-06 NOTE — Patient Instructions (Signed)

## 2021-10-06 NOTE — Progress Notes (Signed)
Future Appointments  Date Time Provider Department  10/07/2021  2:00 PM Unk Pinto, MD [facility]  10/09/2022  2:00 PM Unk Pinto, MD [facility]            This very nice 61 y.o. MWM presents for a Screening /Preventative Visit & comprehensive evaluation and management of multiple medical co-morbidities.  Patient has been followed expectantly for  for elevated BP, Cholesterol,   glucose intolerance and Vitamin D Deficiency. He relates that he now only has "Catastrophic insurance coverage for Heart attacks, or Strokes."       Patient is monitored expectantly for elevated BP.  Patient's BP has been controlled at home.  Today's BP is at 122/70.  Last year's labs did show Stage 2 CKD with GFR 65. Patient denies any cardiac symptoms as chest pain, palpitations, shortness of breath, dizziness or ankle swelling.       Patient's hyperlipidemia is not controlled with diet.  Last lipids were not at goal:  Lab Results  Component Value Date   CHOL 192 10/01/2020   HDL 48 10/01/2020   LDLCALC 107 (H) 10/01/2020   TRIG 258 (H) 10/01/2020   CHOLHDL 4.0 10/01/2020         Patient is screened expectantly for glucose intolerance and patient denies reactive hypoglycemic symptoms, visual blurring, diabetic polys or paresthesias. Last A1c was Normal :   Lab Results  Component Value Date   HGBA1C 5.4 10/01/2020         Patient has had 2 very low Vitamin B12 levels of 300 & 303 the last 2 years & was advised to take SL Vit B12.        Finally, patient has history of Vitamin D Deficiency and last vitamin D was very low :    Lab Results  Component Value Date   VD25OH 63 (L) 10/01/2020     Current Outpatient Medications on File Prior to Visit  Medication Sig   cetirizine (ZYRTEC) 10 MG tablet Take 10 mg daily.    dicyclomine (BENTYL) 20 MG tablet TAKE 1 TABLET THREE TIMES DAILY AS NEEDED    FLONASE nasal spray Place 1 spray into both nostrils daily.    Allergies  Allergen  Reactions   Codeine Other (See Comments)    Stomach ache    Past Medical History:  Diagnosis Date   Allergies    Diverticulosis    Kidney stone     Health Maintenance  Topic Date Due   COVID-19 Vaccine (1) Never done   Pneumococcal Vaccine 63-96 Years old (1 - PCV) Never done   Hepatitis C Screening  Never done   Zoster Vaccines- Shingrix (1 of 2) Never done   COLONOSCOPY  Never done   INFLUENZA VACCINE  Never done   TETANUS/TDAP  07/12/2029   HPV VACCINES  Aged Out   HIV Screening  Discontinued    Immunization History  Administered Date(s) Administered   PPD Test 07/13/2019   Tdap 07/13/2019    Last Colon - patient was ordered prep for colonoscopy, but never showed for app't.  Past Surgical History:  Procedure Laterality Date   NO PAST SURGERIES       Family History  Problem Relation Age of Onset   Breast cancer Mother    Lymphoma Mother    Dementia Mother    Lung cancer Mother        remote smoker   Cancer Sister 8       GYN  Heart disease Maternal Grandfather    Parkinson's disease Paternal Uncle    Colon cancer Neg Hx    Colon polyps Neg Hx    Esophageal cancer Neg Hx    Rectal cancer Neg Hx    Stomach cancer Neg Hx      Social History   Tobacco Use   Smoking status: Never   Smokeless tobacco: Never  Vaping Use   Vaping Use: Never used  Substance Use Topics   Alcohol use: No    Comment: rare   Drug use: No      ROS Constitutional: Denies fever, chills, weight loss/gain, headaches, insomnia,  night sweats or change in appetite. Does c/o fatigue. Eyes: Denies redness, blurred vision, diplopia, discharge, itchy or watery eyes.  ENT: Denies discharge, congestion, post nasal drip, epistaxis, sore throat, earache, hearing loss, dental pain, Tinnitus, Vertigo, Sinus pain or snoring.  Cardio: Denies chest pain, palpitations, irregular heartbeat, syncope, dyspnea, diaphoresis, orthopnea, PND, claudication or edema Respiratory: denies cough,  dyspnea, DOE, pleurisy, hoarseness, laryngitis or wheezing.  Gastrointestinal: Denies dysphagia, heartburn, reflux, water brash, pain, cramps, nausea, vomiting, bloating, diarrhea, constipation, hematemesis, melena, hematochezia, jaundice or hemorrhoids Genitourinary: Denies dysuria, frequency, urgency, nocturia, hesitancy, discharge, hematuria or flank pain Musculoskeletal: Denies arthralgia, myalgia, stiffness, Jt. Swelling, pain, limp or strain/sprain. Denies Falls. Skin: Denies puritis, rash, hives, warts, acne, eczema or change in skin lesion Neuro: No weakness, tremor, incoordination, spasms, paresthesia or pain Psychiatric: Denies confusion, memory loss or sensory loss. Denies Depression. Endocrine: Denies change in weight, skin, hair change, nocturia, and paresthesia, diabetic polys, visual blurring or hyper / hypo glycemic episodes.  Heme/Lymph: No excessive bleeding, bruising or enlarged lymph nodes.   Physical Exam  BP 122/70    Pulse 64    Temp 97.9 F (36.6 C)    Resp 16    Ht 6' 1.5" (1.867 m)    Wt 200 lb 12.8 oz (91.1 kg)    SpO2 97%    BMI 26.13 kg/m   General Appearance: Well nourished and well groomed and in no apparent distress.  Eyes: PERRLA, EOMs, conjunctiva no swelling or erythema, normal fundi and vessels. Sinuses: No frontal/maxillary tenderness ENT/Mouth: EACs patent / TMs  nl. Nares clear without erythema, swelling, mucoid exudates. Oral hygiene is good. No erythema, swelling, or exudate. Tongue normal, non-obstructing. Tonsils not swollen or erythematous. Hearing normal.  Neck: Supple, thyroid not palpable. No bruits, nodes or JVD. Respiratory: Respiratory effort normal.  BS equal and clear bilateral without rales, rhonci, wheezing or stridor. Cardio: Heart sounds are normal with regular rate and rhythm and no murmurs, rubs or gallops. Peripheral pulses are normal and equal bilaterally without edema. No aortic or femoral bruits. Chest: symmetric with normal  excursions and percussion.  Abdomen: Soft, with Nl bowel sounds. Nontender, no guarding, rebound, hernias, masses, or organomegaly.  Lymphatics: Non tender without lymphadenopathy.  Musculoskeletal: Full ROM all peripheral extremities, joint stability, 5/5 strength, and normal gait. Skin: Warm and dry without rashes, lesions, cyanosis, clubbing or  ecchymosis.  Neuro: Cranial nerves intact, reflexes equal bilaterally. Normal muscle tone, no cerebellar symptoms. Sensation intact.  Pysch: Alert and oriented X 3 with normal affect, insight and judgment appropriate.   Assessment and Plan  1. Annual Preventative/Screening Exam        2. Screening for hypertension  3. Lipid screening  4. CKD (chronic kidney disease) stage 2, GFR 60-89 ml/min  5. Abnormal glucose  6. Vitamin D deficiency  7. Screening for prostate cancer  8. Screening for cardiovascular condition   9. FHx: heart disease   10. Screening for colon cancer  - POC Hemoccult Bld/Stl (3-Cd Home Screen); Future  11. Vitamin B12 deficiency   12. Fatigue   13. Medication management  - Patient declined all labs & EKG due to expense as he's having no current complaints.                     Patient was counseled in prudent diet, weight control to achieve/maintain BMI less than 25, BP monitoring, regular exercise and medications as discussed.  Discussed med effects and SE's. Routine screening labs and tests as requested were deferred by patient.  Over 30 minutes of exam, counseling, chart review and high complex critical decision making was performed   Kirtland Bouchard, MD

## 2021-10-07 ENCOUNTER — Other Ambulatory Visit: Payer: Self-pay

## 2021-10-07 ENCOUNTER — Encounter: Payer: Self-pay | Admitting: Internal Medicine

## 2021-10-07 ENCOUNTER — Ambulatory Visit (INDEPENDENT_AMBULATORY_CARE_PROVIDER_SITE_OTHER): Payer: Self-pay | Admitting: Internal Medicine

## 2021-10-07 VITALS — BP 122/70 | HR 64 | Temp 97.9°F | Resp 16 | Ht 73.5 in | Wt 200.8 lb

## 2021-10-07 DIAGNOSIS — R7309 Other abnormal glucose: Secondary | ICD-10-CM

## 2021-10-07 DIAGNOSIS — Z125 Encounter for screening for malignant neoplasm of prostate: Secondary | ICD-10-CM

## 2021-10-07 DIAGNOSIS — R5383 Other fatigue: Secondary | ICD-10-CM

## 2021-10-07 DIAGNOSIS — Z8249 Family history of ischemic heart disease and other diseases of the circulatory system: Secondary | ICD-10-CM

## 2021-10-07 DIAGNOSIS — N182 Chronic kidney disease, stage 2 (mild): Secondary | ICD-10-CM

## 2021-10-07 DIAGNOSIS — Z13220 Encounter for screening for lipoid disorders: Secondary | ICD-10-CM

## 2021-10-07 DIAGNOSIS — Z0001 Encounter for general adult medical examination with abnormal findings: Secondary | ICD-10-CM

## 2021-10-07 DIAGNOSIS — Z79899 Other long term (current) drug therapy: Secondary | ICD-10-CM

## 2021-10-07 DIAGNOSIS — E538 Deficiency of other specified B group vitamins: Secondary | ICD-10-CM

## 2021-10-07 DIAGNOSIS — Z1211 Encounter for screening for malignant neoplasm of colon: Secondary | ICD-10-CM

## 2021-10-07 DIAGNOSIS — E559 Vitamin D deficiency, unspecified: Secondary | ICD-10-CM

## 2021-10-07 DIAGNOSIS — Z136 Encounter for screening for cardiovascular disorders: Secondary | ICD-10-CM

## 2022-10-09 ENCOUNTER — Ambulatory Visit (INDEPENDENT_AMBULATORY_CARE_PROVIDER_SITE_OTHER): Payer: Self-pay | Admitting: Internal Medicine

## 2022-10-09 DIAGNOSIS — Z91199 Patient's noncompliance with other medical treatment and regimen due to unspecified reason: Secondary | ICD-10-CM

## 2022-10-09 NOTE — Patient Instructions (Signed)

## 2022-10-09 NOTE — Progress Notes (Signed)
Marcus Roberson                                         Future Appointments  Date Time Provider Department  10/07/2021  2:00 PM [name], MD [facility]  10/09/2022  2:00 PM [name], MD [facility]            This very nice 62 y.o. MWM  with  hx/o  elevated BP, Cholesterol,   glucose intolerance and Vitamin D Deficiency  who presents for a Screening /Preventative Visit & comprehensive evaluation and management of multiple medical co-morbidities.         Patient is monitored expectantly for elevated BP.  Patient's BP has been controlled at home.  Today's BP is at 122/70.   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 Patient denies any cardiac symptoms as chest pain, palpitations, shortness of breath, dizziness or ankle swelling.       Patient's hyperlipidemia is not controlled with diet.  Last lipids were not at goal:  Lab Results  Component Value Date   CHOL 192 10/01/2020   HDL 48 10/01/2020   LDLCALC 107 (H) 10/01/2020   TRIG 258 (H) 10/01/2020   CHOLHDL 4.0 10/01/2020        Patient is screened expectantly for glucose intolerance and patient denies reactive hypoglycemic symptoms, visual blurring, diabetic polys or paresthesias. Last A1c was Normal :   Lab Results  Component Value Date   HGBA1C 5.4 10/01/2020         Patient has had 2 very low Vitamin B12 levels of 300 & 303 the last 2 years & was advised to take SL Vit B12.        Finally, patient has history of  Vitamin D Deficiency and last vitamin D was very low :    Lab Results  Component Value Date   VD25OH 29 (L) 10/01/2020    \    Current Outpatient Medications  Medication Instructions  cetirizine (ZYRTEC) 10 mg, Oral, Daily   dicyclomine (BENTYL) 20 MG tablet TAKE 1 TABLET BY MOUTH THREE TIMES DAILY AS NEEDED FOR NAUSEA, BLOATING AND ABDOMINAL CRAMPING/ACHING   fluticasone (FLONASE) 50 MCG/ACT nasal spray 1 spray, Each Nare, Daily   Na Sulfate-K Sulfate-Mg Sulf 17.5-3.13-1.6 GM/177ML SOLN Suprep (no substitutions)-TAKE AS DIRECTED.      Allergies  Allergen Reactions   Codeine Other (See Comments)    Stomach ache    Past Medical History:  Diagnosis Date   Allergies    Diverticulosis    Kidney stone     Health Maintenance  Topic Date Due   COVID-19 Vaccine (1) Never done   Pneumococcal Vaccine 55-10 Years old (1 - PCV) Never done   Hepatitis C Screening  Never done   Zoster Vaccines- Shingrix (1 of 2) Never done   COLONOSCOPY  Never done   INFLUENZA VACCINE  Never done   TETANUS/TDAP  07/12/2029   HPV VACCINES  Aged Out   HIV Screening  Discontinued    Immunization History  Administered Date(s) Administered   PPD Test 07/13/2019   Tdap 07/13/2019    Last Colon - patient was ordered prep for colonoscopy, but never showed for app't.  Past Surgical History:  Procedure Laterality Date   NO PAST SURGERIES       Family History  Problem Relation Age of Onset   Breast cancer Mother    Lymphoma Mother    Dementia Mother    Lung cancer Mother        remote smoker   Cancer Sister 44       GYN   Heart disease Maternal Grandfather    Parkinson's disease Paternal Uncle    Colon cancer Neg Hx    Colon polyps Neg Hx    Esophageal cancer Neg Hx    Rectal cancer Neg Hx    Stomach cancer Neg Hx      Social History   Tobacco Use   Smoking status: Never   Smokeless tobacco: Never  Vaping Use   Vaping Use: Never used  Substance Use Topics   Alcohol use:  No    Comment: rare   Drug use: No      ROS Constitutional: Denies fever, chills, weight loss/gain, headaches, insomnia,  night sweats or change in appetite. Does c/o fatigue. Eyes: Denies redness, blurred vision, diplopia, discharge, itchy or watery eyes.  ENT: Denies discharge, congestion, post nasal drip, epistaxis, sore throat, earache, hearing loss, dental pain, Tinnitus, Vertigo, Sinus pain or snoring.  Cardio: Denies chest pain, palpitations, irregular heartbeat, syncope, dyspnea, diaphoresis, orthopnea, PND, claudication or edema Respiratory: denies cough, dyspnea, DOE, pleurisy, hoarseness, laryngitis or wheezing.  Gastrointestinal: Denies dysphagia, heartburn, reflux, water brash, pain, cramps, nausea, vomiting, bloating, diarrhea, constipation, hematemesis, melena, hematochezia, jaundice or hemorrhoids Genitourinary: Denies dysuria, frequency, urgency, nocturia, hesitancy, discharge, hematuria or flank pain Musculoskeletal: Denies arthralgia, myalgia, stiffness, Jt. Swelling, pain, limp or strain/sprain. Denies Falls. Skin: Denies puritis, rash, hives, warts, acne, eczema or change in skin lesion Neuro: No weakness, tremor, incoordination, spasms, paresthesia or pain Psychiatric: Denies confusion, memory loss or sensory loss. Denies Depression. Endocrine: Denies change in weight, skin, hair change, nocturia, and paresthesia, diabetic polys, visual blurring or hyper / hypo glycemic episodes.  Heme/Lymph: No excessive bleeding, bruising or enlarged lymph nodes.   Physical Exam  There were no vitals taken for this visit.  General Appearance: Well nourished and well groomed and in no apparent distress.  Eyes: PERRLA, EOMs, conjunctiva  no swelling or erythema, normal fundi and vessels. Sinuses: No frontal/maxillary tenderness ENT/Mouth: EACs patent / TMs  nl. Nares clear without erythema, swelling, mucoid exudates. Oral hygiene is good. No erythema, swelling, or exudate. Tongue  normal, non-obstructing. Tonsils not swollen or erythematous. Hearing normal.  Neck: Supple, thyroid not palpable. No bruits, nodes or JVD. Respiratory: Respiratory effort normal.  BS equal and clear bilateral without rales, rhonci, wheezing or stridor. Cardio: Heart sounds are normal with regular rate and rhythm and no murmurs, rubs or gallops. Peripheral pulses are normal and equal bilaterally without edema. No aortic or femoral bruits. Chest: symmetric with normal excursions and percussion.  Abdomen: Soft, with Nl bowel sounds. Nontender, no guarding, rebound, hernias, masses, or organomegaly.  Lymphatics: Non tender without lymphadenopathy.  Musculoskeletal: Full ROM all peripheral extremities, joint stability, 5/5 strength, and normal gait. Skin: Warm and dry without rashes, lesions, cyanosis, clubbing or  ecchymosis.  Neuro: Cranial nerves intact, reflexes equal bilaterally. Normal muscle tone, no cerebellar symptoms. Sensation intact.  Pysch: Alert and oriented X 3 with normal affect, insight and judgment appropriate.   Assessment and Plan  1. Annual Preventative/Screening Exam        2. Screening for hypertension  3. Lipid screening  4. CKD (chronic kidney disease) stage 2, GFR 60-89 ml/min  5. Abnormal glucose  6. Vitamin D deficiency  7. Screening for prostate cancer   8. Screening for cardiovascular condition   9. FHx: heart disease   10. Screening for colon cancer  - POC Hemoccult Bld/Stl   11. Vitamin B12 deficiency   12. Fatigue   13. Medication management  - Patient declined all labs & EKG due to expense as he's having no current complaints.                     Patient was counseled in prudent diet, weight control to achieve/maintain BMI less than 25, BP monitoring, regular exercise and medications as discussed.  Discussed med effects and SE's. Routine screening labs and tests as requested were deferred by patient.  Over 30 minutes of exam, counseling,  chart review and high complex critical decision making was performed   Kirtland Bouchard, MD

## 2022-10-16 ENCOUNTER — Ambulatory Visit (INDEPENDENT_AMBULATORY_CARE_PROVIDER_SITE_OTHER): Payer: Self-pay | Admitting: Internal Medicine

## 2022-10-16 ENCOUNTER — Encounter: Payer: Self-pay | Admitting: Internal Medicine

## 2022-10-16 VITALS — BP 110/68 | HR 52 | Temp 97.8°F | Resp 16 | Ht 73.5 in | Wt 194.4 lb

## 2022-10-16 DIAGNOSIS — K580 Irritable bowel syndrome with diarrhea: Secondary | ICD-10-CM

## 2022-10-16 DIAGNOSIS — Z1212 Encounter for screening for malignant neoplasm of rectum: Secondary | ICD-10-CM

## 2022-10-16 DIAGNOSIS — R7309 Other abnormal glucose: Secondary | ICD-10-CM

## 2022-10-16 DIAGNOSIS — E782 Mixed hyperlipidemia: Secondary | ICD-10-CM

## 2022-10-16 DIAGNOSIS — Z0001 Encounter for general adult medical examination with abnormal findings: Secondary | ICD-10-CM

## 2022-10-16 DIAGNOSIS — Z8249 Family history of ischemic heart disease and other diseases of the circulatory system: Secondary | ICD-10-CM

## 2022-10-16 DIAGNOSIS — Z79899 Other long term (current) drug therapy: Secondary | ICD-10-CM

## 2022-10-16 DIAGNOSIS — R5383 Other fatigue: Secondary | ICD-10-CM

## 2022-10-16 DIAGNOSIS — Z136 Encounter for screening for cardiovascular disorders: Secondary | ICD-10-CM

## 2022-10-16 DIAGNOSIS — Z1211 Encounter for screening for malignant neoplasm of colon: Secondary | ICD-10-CM

## 2022-10-16 DIAGNOSIS — Z125 Encounter for screening for malignant neoplasm of prostate: Secondary | ICD-10-CM

## 2022-10-16 DIAGNOSIS — N182 Chronic kidney disease, stage 2 (mild): Secondary | ICD-10-CM

## 2022-10-16 DIAGNOSIS — E538 Deficiency of other specified B group vitamins: Secondary | ICD-10-CM

## 2022-10-16 DIAGNOSIS — E559 Vitamin D deficiency, unspecified: Secondary | ICD-10-CM

## 2022-10-16 MED ORDER — DICYCLOMINE HCL 20 MG PO TABS: ORAL_TABLET | ORAL | 1 refills | Status: AC

## 2022-10-16 NOTE — Progress Notes (Signed)
Future Appointments  Date Time Provider Department  10/16/2022 10:00 AM Unk Pinto, MD [facility]  10/21/2023 10:00 AM Unk Pinto, MD [facility]           This very nice 62 y.o. MWM  with  hx/o  elevated BP, Cholesterol,   glucose intolerance and Vitamin D Deficiency  who presents for a Screening /Preventative Visit & comprehensive evaluation and management of multiple medical co-morbidities, but He relates that he now  has  no Insurance coverage & defers any labs or EKG testing.       Patient has been monitored expectantly for elevated BP.  Patient's BP has been controlled and today's BP is at  110/68.   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 Patient denies any cardiac symptoms as chest pain, palpitations, shortness of breath, dizziness or ankle swelling.        Patient's hyperlipidemia has not been controlled with diet.  Last lipids were not at goal:  Lab Results  Component Value Date   CHOL 192 10/01/2020   HDL 48 10/01/2020   LDLCALC 107 (H) 10/01/2020   TRIG 258 (H) 10/01/2020   CHOLHDL 4.0 10/01/2020         Patient has been screened expectantly for glucose intolerance and patient denies reactive hypoglycemic symptoms, visual blurring, diabetic polys or paresthesias. Last A1c was Normal :   Lab Results  Component Value Date   HGBA1C 5.4 10/01/2020         Patient has had 2 very low Vitamin B12 levels of 300 & 303 the last 2 years & was advised to take SL Vit B12.       Finally, patient has history of Vitamin D Deficiency and last vitamin D was very low  & he was advised to take Vit D 10,000 units /day :    Lab Results  Component Value Date   VD25OH 29 (L) 10/01/2020       Current Outpatient Medications  Medication Instructions   cetirizine   10 mg  Daily   dicyclomine 20 MG tablet Take 1 tablet  3 x/day as needed   FLONASE nasal spray 1 spray, Each Nare, Daily   He does NOT take vit's D & B12 as advised      Allergies  Allergen  Reactions   Codeine Other (See Comments)    Stomach ache    Past Medical History:  Diagnosis Date   Allergies    Diverticulosis    Kidney stone      Health Maintenance  Topic Date Due   COVID-19 Vaccine (1) Never done   Pneumococcal Vaccine 56-15 Years old (1 - PCV) Never done   Hepatitis C Screening  Never done   Zoster Vaccines- Shingrix (1 of 2) Never done   COLONOSCOPY  Never done   INFLUENZA VACCINE  Never done   TETANUS/TDAP  07/12/2029   HPV VACCINES  Aged Out   HIV Screening  Discontinued    Immunization History  Administered Date(s) Administered   PPD Test 07/13/2019   Tdap 07/13/2019     Last Colon - patient was ordered prep for colonoscopy,  but never showed for app't.  Past Surgical History:  Procedure Laterality Date   NO PAST SURGERIES       Family History  Problem Relation Age of Onset   Breast cancer Mother    Lymphoma Mother    Dementia Mother    Lung cancer Mother  remote smoker   Cancer Sister 45       GYN   Heart disease Maternal Grandfather    Parkinson's disease Paternal Uncle    Colon cancer Neg Hx    Colon polyps Neg Hx    Esophageal cancer Neg Hx    Rectal cancer Neg Hx    Stomach cancer Neg Hx      Social History   Tobacco Use   Smoking status: Never   Smokeless tobacco: Never  Vaping Use   Vaping Use: Never used  Substance Use Topics   Alcohol use: No    Comment: rare   Drug use: No      ROS Constitutional: Denies fever, chills, weight loss/gain, headaches, insomnia,  night sweats or change in appetite. Does c/o fatigue. Eyes: Denies redness, blurred vision, diplopia, discharge, itchy or watery eyes.  ENT: Denies discharge, congestion, post nasal drip, epistaxis, sore throat, earache, hearing loss, dental pain, Tinnitus, Vertigo, Sinus pain or snoring.  Cardio: Denies chest pain, palpitations, irregular heartbeat, syncope, dyspnea, diaphoresis, orthopnea, PND, claudication or edema Respiratory: denies  cough, dyspnea, DOE, pleurisy, hoarseness, laryngitis or wheezing.  Gastrointestinal: Denies dysphagia, heartburn, reflux, water brash, pain, cramps, nausea, vomiting, bloating, diarrhea, constipation, hematemesis, melena, hematochezia, jaundice or hemorrhoids Genitourinary: Denies dysuria, frequency, urgency, nocturia, hesitancy, discharge, hematuria or flank pain Musculoskeletal: Denies arthralgia, myalgia, stiffness, Jt. Swelling, pain, limp or strain/sprain. Denies Falls. Skin: Denies puritis, rash, hives, warts, acne, eczema or change in skin lesion Neuro: No weakness, tremor, incoordination, spasms, paresthesia or pain Psychiatric: Denies confusion, memory loss or sensory loss. Denies Depression. Endocrine: Denies change in weight, skin, hair change, nocturia, and paresthesia, diabetic polys, visual blurring or hyper / hypo glycemic episodes.  Heme/Lymph: No excessive bleeding, bruising or enlarged lymph nodes.   Physical Exam  BP 110/68   Pulse (!) 52   Temp 97.8 F (36.6 C)   Resp 16   Ht 6' 1.5" (1.867 m)   Wt 194 lb 6.4 oz (88.2 kg)   SpO2 97%   BMI 25.30 kg/m   General Appearance: Well nourished and well groomed and in no apparent distress.  Eyes: PERRLA, EOMs, conjunctiva no swelling or erythema, normal fundi and vessels. Sinuses: No frontal/maxillary tenderness ENT/Mouth: EACs patent / TMs  nl. Nares clear without erythema, swelling, mucoid exudates. Oral hygiene is good. No erythema, swelling, or exudate. Tongue normal, non-obstructing. Tonsils not swollen or erythematous. Hearing normal.  Neck: Supple, thyroid not palpable. No bruits, nodes or JVD. Respiratory: Respiratory effort normal.  BS equal and clear bilateral without rales, rhonci, wheezing or stridor. Cardio: Heart sounds are normal with regular rate and rhythm and no murmurs, rubs or gallops. Peripheral pulses are normal and equal bilaterally without edema. No aortic or femoral bruits. Chest: symmetric with  normal excursions and percussion.  Abdomen: Soft, with Nl bowel sounds. Nontender, no guarding, rebound, hernias, masses, or organomegaly.  Lymphatics: Non tender without lymphadenopathy.  Musculoskeletal: Full ROM all peripheral extremities, joint stability, 5/5 strength, and normal gait. Skin: Warm and dry without rashes, lesions, cyanosis, clubbing or  ecchymosis.  Neuro: Cranial nerves intact, reflexes equal bilaterally. Normal muscle tone, no cerebellar symptoms. Sensation intact.  Pysch: Alert and oriented X 3 with normal affect, insight and judgment appropriate.   Assessment and Plan  1. Annual Preventative/Screening Exam        2. Screening for hypertension  3. Lipid screening  4. CKD (chronic kidney disease) stage 2, GFR 60-89 ml/min  5. Abnormal glucose  6.  Vitamin D deficiency  7. Screening for prostate cancer  8. Screening for cardiovascular condition  9. FHx: heart disease  10. Screening for colon cancer  - POC Hemoccult Bld/Stl   11. Vitamin B12 deficiency  12. Fatigue  13. Medication management  - Patient declined all labs & EKG due to expense as he's having no current complaints.                     Patient was counseled in prudent diet, weight control to achieve/maintain BMI less than 25, BP monitoring, regular exercise and medications as discussed.  Discussed med effects and SE's. Routine screening labs and tests as requested were deferred by patient.  Patient wa strongly to investigate some type of insurance. Over 30 minutes of exam, counseling, chart review and high complex critical decision making was performed   Marinus Maw, MD

## 2022-10-16 NOTE — Patient Instructions (Signed)

## 2022-10-19 ENCOUNTER — Encounter: Payer: Self-pay | Admitting: Internal Medicine

## 2023-10-19 ENCOUNTER — Encounter: Payer: Self-pay | Admitting: Internal Medicine

## 2023-10-19 ENCOUNTER — Ambulatory Visit: Payer: Self-pay | Admitting: Internal Medicine

## 2023-10-21 ENCOUNTER — Encounter: Payer: Self-pay | Admitting: Internal Medicine
